# Patient Record
Sex: Male | Born: 1990
Health system: Southern US, Community
[De-identification: ages and names within clinical notes are randomized; demographics above are authoritative.]

## PROBLEM LIST (undated history)

## (undated) DIAGNOSIS — F32A Depression, unspecified: Secondary | ICD-10-CM

## (undated) DIAGNOSIS — J189 Pneumonia, unspecified organism: Secondary | ICD-10-CM

---

## 2018-10-28 DIAGNOSIS — Z6836 Body mass index (BMI) 36.0-36.9, adult: Secondary | ICD-10-CM | POA: Diagnosis not present

## 2018-10-28 DIAGNOSIS — F17200 Nicotine dependence, unspecified, uncomplicated: Secondary | ICD-10-CM | POA: Diagnosis not present

## 2018-10-28 DIAGNOSIS — A630 Anogenital (venereal) warts: Secondary | ICD-10-CM | POA: Diagnosis not present

## 2018-11-24 DIAGNOSIS — S0292XA Unspecified fracture of facial bones, initial encounter for closed fracture: Secondary | ICD-10-CM

## 2018-11-24 DIAGNOSIS — S52501A Unspecified fracture of the lower end of right radius, initial encounter for closed fracture: Secondary | ICD-10-CM

## 2018-11-24 DIAGNOSIS — S02411A LeFort I fracture, initial encounter for closed fracture: Secondary | ICD-10-CM

## 2018-11-24 DIAGNOSIS — T148XXA Other injury of unspecified body region, initial encounter: Secondary | ICD-10-CM

## 2018-11-24 DIAGNOSIS — S270XXA Traumatic pneumothorax, initial encounter: Secondary | ICD-10-CM

## 2018-11-24 DIAGNOSIS — S32009D Unspecified fracture of unspecified lumbar vertebra, subsequent encounter for fracture with routine healing: Secondary | ICD-10-CM

## 2018-11-24 DIAGNOSIS — S2242XD Multiple fractures of ribs, left side, subsequent encounter for fracture with routine healing: Secondary | ICD-10-CM

## 2018-11-24 DIAGNOSIS — S02412A LeFort II fracture, initial encounter for closed fracture: Secondary | ICD-10-CM

## 2018-11-24 DIAGNOSIS — S36039A Unspecified laceration of spleen, initial encounter: Secondary | ICD-10-CM

## 2018-11-24 DIAGNOSIS — Z7409 Other reduced mobility: Secondary | ICD-10-CM

## 2018-11-24 DIAGNOSIS — S92909A Unspecified fracture of unspecified foot, initial encounter for closed fracture: Secondary | ICD-10-CM

## 2018-11-24 DIAGNOSIS — S92009B Unspecified fracture of unspecified calcaneus, initial encounter for open fracture: Secondary | ICD-10-CM

## 2018-11-24 DIAGNOSIS — S02413A LeFort III fracture, initial encounter for closed fracture: Secondary | ICD-10-CM

## 2018-11-24 DIAGNOSIS — M84475K Pathological fracture, left foot, subsequent encounter for fracture with nonunion: Secondary | ICD-10-CM

## 2018-11-24 DIAGNOSIS — Z789 Other specified health status: Secondary | ICD-10-CM

## 2018-11-24 DIAGNOSIS — S62001A Unspecified fracture of navicular [scaphoid] bone of right wrist, initial encounter for closed fracture: Secondary | ICD-10-CM

## 2018-11-24 DIAGNOSIS — S82251C Displaced comminuted fracture of shaft of right tibia, initial encounter for open fracture type IIIA, IIIB, or IIIC: Secondary | ICD-10-CM

## 2018-11-24 DIAGNOSIS — L089 Local infection of the skin and subcutaneous tissue, unspecified: Secondary | ICD-10-CM

## 2018-11-24 HISTORY — DX: Unspecified fracture of unspecified calcaneus, initial encounter for open fracture: S92.009B

## 2018-11-24 HISTORY — DX: Pathological fracture, left foot, subsequent encounter for fracture with nonunion: M84.475K

## 2018-11-24 HISTORY — PX: TRACHEOSTOMY: SUR1362

## 2018-11-24 HISTORY — DX: Unspecified fracture of unspecified foot, initial encounter for closed fracture: S92.909A

## 2018-11-24 HISTORY — DX: Unspecified fracture of facial bones, initial encounter for closed fracture: S02.92XA

## 2018-11-24 HISTORY — DX: Unspecified fracture of the lower end of right radius, initial encounter for closed fracture: S52.501A

## 2018-11-24 HISTORY — DX: Lefort i fracture, initial encounter for closed fracture: S02.411A

## 2018-11-24 HISTORY — DX: Other specified health status: Z78.9

## 2018-11-24 HISTORY — DX: Unspecified fracture of unspecified lumbar vertebra, subsequent encounter for fracture with routine healing: S32.009D

## 2018-11-24 HISTORY — DX: Displaced comminuted fracture of shaft of right tibia, initial encounter for open fracture type IIIA, IIIB, or IIIC: S82.251C

## 2018-11-24 HISTORY — DX: Unspecified laceration of spleen, initial encounter: S36.039A

## 2018-11-24 HISTORY — DX: Traumatic pneumothorax, initial encounter: S27.0XXA

## 2018-11-24 HISTORY — DX: Unspecified fracture of navicular (scaphoid) bone of right wrist, initial encounter for closed fracture: S62.001A

## 2018-11-24 HISTORY — DX: Multiple fractures of ribs, left side, subsequent encounter for fracture with routine healing: S22.42XD

## 2018-11-24 HISTORY — PX: WRIST SURGERY: SHX841

## 2018-11-24 HISTORY — DX: Lefort ii fracture, initial encounter for closed fracture: S02.412A

## 2018-11-24 HISTORY — DX: Other reduced mobility: Z74.09

## 2018-11-24 HISTORY — DX: LeFort III fracture, initial encounter for closed fracture: S02.413A

## 2018-11-24 HISTORY — DX: Local infection of the skin and subcutaneous tissue, unspecified: L08.9

## 2018-11-24 HISTORY — DX: Other injury of unspecified body region, initial encounter: T14.8XXA

## 2019-01-17 DIAGNOSIS — R5383 Other fatigue: Secondary | ICD-10-CM | POA: Diagnosis not present

## 2019-01-17 DIAGNOSIS — Z0000 Encounter for general adult medical examination without abnormal findings: Secondary | ICD-10-CM | POA: Diagnosis not present

## 2019-01-17 DIAGNOSIS — A630 Anogenital (venereal) warts: Secondary | ICD-10-CM | POA: Diagnosis not present

## 2019-01-17 DIAGNOSIS — F17200 Nicotine dependence, unspecified, uncomplicated: Secondary | ICD-10-CM | POA: Diagnosis not present

## 2019-01-17 DIAGNOSIS — Z87898 Personal history of other specified conditions: Secondary | ICD-10-CM | POA: Diagnosis not present

## 2019-03-04 DIAGNOSIS — S0280XA Fracture of other specified skull and facial bones, unspecified side, initial encounter for closed fracture: Secondary | ICD-10-CM | POA: Diagnosis not present

## 2019-03-04 DIAGNOSIS — S36031A Moderate laceration of spleen, initial encounter: Secondary | ICD-10-CM | POA: Diagnosis not present

## 2019-03-04 DIAGNOSIS — T1490XA Injury, unspecified, initial encounter: Secondary | ICD-10-CM | POA: Diagnosis not present

## 2019-03-04 DIAGNOSIS — S22089A Unspecified fracture of T11-T12 vertebra, initial encounter for closed fracture: Secondary | ICD-10-CM | POA: Diagnosis not present

## 2019-03-04 DIAGNOSIS — S32039A Unspecified fracture of third lumbar vertebra, initial encounter for closed fracture: Secondary | ICD-10-CM | POA: Diagnosis not present

## 2019-03-04 DIAGNOSIS — M79642 Pain in left hand: Secondary | ICD-10-CM | POA: Diagnosis not present

## 2019-03-04 DIAGNOSIS — S62034A Nondisplaced fracture of proximal third of navicular [scaphoid] bone of right wrist, initial encounter for closed fracture: Secondary | ICD-10-CM | POA: Diagnosis not present

## 2019-03-04 DIAGNOSIS — Z1159 Encounter for screening for other viral diseases: Secondary | ICD-10-CM | POA: Diagnosis not present

## 2019-03-04 DIAGNOSIS — S92141B Displaced dome fracture of right talus, initial encounter for open fracture: Secondary | ICD-10-CM | POA: Diagnosis not present

## 2019-03-04 DIAGNOSIS — S52501A Unspecified fracture of the lower end of right radius, initial encounter for closed fracture: Secondary | ICD-10-CM | POA: Insufficient documentation

## 2019-03-04 DIAGNOSIS — S32049A Unspecified fracture of fourth lumbar vertebra, initial encounter for closed fracture: Secondary | ICD-10-CM | POA: Diagnosis not present

## 2019-03-04 DIAGNOSIS — R0689 Other abnormalities of breathing: Secondary | ICD-10-CM | POA: Diagnosis not present

## 2019-03-04 DIAGNOSIS — T794XXA Traumatic shock, initial encounter: Secondary | ICD-10-CM | POA: Diagnosis not present

## 2019-03-04 DIAGNOSIS — M25571 Pain in right ankle and joints of right foot: Secondary | ICD-10-CM | POA: Diagnosis not present

## 2019-03-04 DIAGNOSIS — S92322B Displaced fracture of second metatarsal bone, left foot, initial encounter for open fracture: Secondary | ICD-10-CM | POA: Diagnosis not present

## 2019-03-04 DIAGNOSIS — S92322A Displaced fracture of second metatarsal bone, left foot, initial encounter for closed fracture: Secondary | ICD-10-CM | POA: Diagnosis not present

## 2019-03-04 DIAGNOSIS — S85151A Other specified injury of anterior tibial artery, right leg, initial encounter: Secondary | ICD-10-CM | POA: Diagnosis not present

## 2019-03-04 DIAGNOSIS — S52611A Displaced fracture of right ulna styloid process, initial encounter for closed fracture: Secondary | ICD-10-CM | POA: Diagnosis not present

## 2019-03-04 DIAGNOSIS — S92141A Displaced dome fracture of right talus, initial encounter for closed fracture: Secondary | ICD-10-CM | POA: Diagnosis not present

## 2019-03-04 DIAGNOSIS — S92901A Unspecified fracture of right foot, initial encounter for closed fracture: Secondary | ICD-10-CM | POA: Diagnosis not present

## 2019-03-04 DIAGNOSIS — S32019A Unspecified fracture of first lumbar vertebra, initial encounter for closed fracture: Secondary | ICD-10-CM | POA: Diagnosis not present

## 2019-03-04 DIAGNOSIS — T80319A ABO incompatibility with hemolytic transfusion reaction, unspecified, initial encounter: Secondary | ICD-10-CM | POA: Diagnosis not present

## 2019-03-04 DIAGNOSIS — J9811 Atelectasis: Secondary | ICD-10-CM | POA: Diagnosis not present

## 2019-03-04 DIAGNOSIS — S32029A Unspecified fracture of second lumbar vertebra, initial encounter for closed fracture: Secondary | ICD-10-CM | POA: Diagnosis not present

## 2019-03-04 DIAGNOSIS — S81831A Puncture wound without foreign body, right lower leg, initial encounter: Secondary | ICD-10-CM | POA: Diagnosis not present

## 2019-03-04 DIAGNOSIS — S82461B Displaced segmental fracture of shaft of right fibula, initial encounter for open fracture type I or II: Secondary | ICD-10-CM | POA: Diagnosis not present

## 2019-03-04 DIAGNOSIS — S82221C Displaced transverse fracture of shaft of right tibia, initial encounter for open fracture type IIIA, IIIB, or IIIC: Secondary | ICD-10-CM | POA: Diagnosis not present

## 2019-03-04 DIAGNOSIS — M79605 Pain in left leg: Secondary | ICD-10-CM | POA: Diagnosis not present

## 2019-03-04 DIAGNOSIS — M25572 Pain in left ankle and joints of left foot: Secondary | ICD-10-CM | POA: Diagnosis not present

## 2019-03-04 DIAGNOSIS — S42301A Unspecified fracture of shaft of humerus, right arm, initial encounter for closed fracture: Secondary | ICD-10-CM | POA: Diagnosis not present

## 2019-03-04 DIAGNOSIS — S2242XA Multiple fractures of ribs, left side, initial encounter for closed fracture: Secondary | ICD-10-CM | POA: Diagnosis not present

## 2019-03-04 DIAGNOSIS — G8911 Acute pain due to trauma: Secondary | ICD-10-CM | POA: Diagnosis not present

## 2019-03-04 DIAGNOSIS — Z4682 Encounter for fitting and adjustment of non-vascular catheter: Secondary | ICD-10-CM | POA: Diagnosis not present

## 2019-03-04 DIAGNOSIS — S01511A Laceration without foreign body of lip, initial encounter: Secondary | ICD-10-CM | POA: Diagnosis not present

## 2019-03-04 DIAGNOSIS — S82461C Displaced segmental fracture of shaft of right fibula, initial encounter for open fracture type IIIA, IIIB, or IIIC: Secondary | ICD-10-CM | POA: Diagnosis not present

## 2019-03-04 DIAGNOSIS — S82001A Unspecified fracture of right patella, initial encounter for closed fracture: Secondary | ICD-10-CM | POA: Diagnosis not present

## 2019-03-04 DIAGNOSIS — S0231XA Fracture of orbital floor, right side, initial encounter for closed fracture: Secondary | ICD-10-CM | POA: Diagnosis not present

## 2019-03-04 DIAGNOSIS — S0990XA Unspecified injury of head, initial encounter: Secondary | ICD-10-CM | POA: Diagnosis not present

## 2019-03-04 DIAGNOSIS — S82201J Unspecified fracture of shaft of right tibia, subsequent encounter for open fracture type IIIA, IIIB, or IIIC with delayed healing: Secondary | ICD-10-CM | POA: Diagnosis not present

## 2019-03-04 DIAGNOSIS — S81801A Unspecified open wound, right lower leg, initial encounter: Secondary | ICD-10-CM | POA: Diagnosis not present

## 2019-03-04 DIAGNOSIS — Z4659 Encounter for fitting and adjustment of other gastrointestinal appliance and device: Secondary | ICD-10-CM | POA: Diagnosis not present

## 2019-03-04 DIAGNOSIS — S82451A Displaced comminuted fracture of shaft of right fibula, initial encounter for closed fracture: Secondary | ICD-10-CM | POA: Diagnosis not present

## 2019-03-04 DIAGNOSIS — S82291B Other fracture of shaft of right tibia, initial encounter for open fracture type I or II: Secondary | ICD-10-CM | POA: Diagnosis not present

## 2019-03-04 DIAGNOSIS — S82401B Unspecified fracture of shaft of right fibula, initial encounter for open fracture type I or II: Secondary | ICD-10-CM | POA: Diagnosis not present

## 2019-03-04 DIAGNOSIS — S82251C Displaced comminuted fracture of shaft of right tibia, initial encounter for open fracture type IIIA, IIIB, or IIIC: Secondary | ICD-10-CM | POA: Diagnosis not present

## 2019-03-04 DIAGNOSIS — S8292XA Unspecified fracture of left lower leg, initial encounter for closed fracture: Secondary | ICD-10-CM | POA: Diagnosis not present

## 2019-03-04 DIAGNOSIS — S2232XA Fracture of one rib, left side, initial encounter for closed fracture: Secondary | ICD-10-CM | POA: Diagnosis not present

## 2019-03-04 DIAGNOSIS — R1310 Dysphagia, unspecified: Secondary | ICD-10-CM | POA: Diagnosis not present

## 2019-03-04 DIAGNOSIS — Z041 Encounter for examination and observation following transport accident: Secondary | ICD-10-CM | POA: Diagnosis not present

## 2019-03-04 DIAGNOSIS — S62014A Nondisplaced fracture of distal pole of navicular [scaphoid] bone of right wrist, initial encounter for closed fracture: Secondary | ICD-10-CM | POA: Diagnosis not present

## 2019-03-04 DIAGNOSIS — S82401A Unspecified fracture of shaft of right fibula, initial encounter for closed fracture: Secondary | ICD-10-CM | POA: Diagnosis not present

## 2019-03-04 DIAGNOSIS — S52501D Unspecified fracture of the lower end of right radius, subsequent encounter for closed fracture with routine healing: Secondary | ICD-10-CM | POA: Diagnosis not present

## 2019-03-04 DIAGNOSIS — R633 Feeding difficulties: Secondary | ICD-10-CM | POA: Diagnosis not present

## 2019-03-04 DIAGNOSIS — S92902B Unspecified fracture of left foot, initial encounter for open fracture: Secondary | ICD-10-CM | POA: Diagnosis not present

## 2019-03-04 DIAGNOSIS — R578 Other shock: Secondary | ICD-10-CM | POA: Diagnosis not present

## 2019-03-04 DIAGNOSIS — S92352A Displaced fracture of fifth metatarsal bone, left foot, initial encounter for closed fracture: Secondary | ICD-10-CM | POA: Diagnosis not present

## 2019-03-04 DIAGNOSIS — S32110A Nondisplaced Zone I fracture of sacrum, initial encounter for closed fracture: Secondary | ICD-10-CM | POA: Diagnosis not present

## 2019-03-04 DIAGNOSIS — S82251A Displaced comminuted fracture of shaft of right tibia, initial encounter for closed fracture: Secondary | ICD-10-CM | POA: Diagnosis not present

## 2019-03-04 DIAGNOSIS — S92322D Displaced fracture of second metatarsal bone, left foot, subsequent encounter for fracture with routine healing: Secondary | ICD-10-CM | POA: Diagnosis not present

## 2019-03-04 DIAGNOSIS — S82251B Displaced comminuted fracture of shaft of right tibia, initial encounter for open fracture type I or II: Secondary | ICD-10-CM | POA: Diagnosis not present

## 2019-03-04 DIAGNOSIS — S82201C Unspecified fracture of shaft of right tibia, initial encounter for open fracture type IIIA, IIIB, or IIIC: Secondary | ICD-10-CM | POA: Diagnosis not present

## 2019-03-04 DIAGNOSIS — S52571A Other intraarticular fracture of lower end of right radius, initial encounter for closed fracture: Secondary | ICD-10-CM | POA: Diagnosis not present

## 2019-03-04 DIAGNOSIS — M79672 Pain in left foot: Secondary | ICD-10-CM | POA: Diagnosis not present

## 2019-03-04 DIAGNOSIS — J189 Pneumonia, unspecified organism: Secondary | ICD-10-CM | POA: Diagnosis not present

## 2019-03-04 DIAGNOSIS — S93325A Dislocation of tarsometatarsal joint of left foot, initial encounter: Secondary | ICD-10-CM | POA: Diagnosis not present

## 2019-03-04 DIAGNOSIS — S8262XD Displaced fracture of lateral malleolus of left fibula, subsequent encounter for closed fracture with routine healing: Secondary | ICD-10-CM | POA: Diagnosis not present

## 2019-03-04 DIAGNOSIS — S2222XA Fracture of body of sternum, initial encounter for closed fracture: Secondary | ICD-10-CM | POA: Diagnosis not present

## 2019-03-04 DIAGNOSIS — S52502A Unspecified fracture of the lower end of left radius, initial encounter for closed fracture: Secondary | ICD-10-CM | POA: Diagnosis not present

## 2019-03-04 DIAGNOSIS — S199XXA Unspecified injury of neck, initial encounter: Secondary | ICD-10-CM | POA: Diagnosis not present

## 2019-03-04 DIAGNOSIS — S36420A Contusion of duodenum, initial encounter: Secondary | ICD-10-CM | POA: Diagnosis not present

## 2019-03-04 DIAGNOSIS — S92001B Unspecified fracture of right calcaneus, initial encounter for open fracture: Secondary | ICD-10-CM | POA: Diagnosis not present

## 2019-03-04 DIAGNOSIS — S02411A LeFort I fracture, initial encounter for closed fracture: Secondary | ICD-10-CM | POA: Diagnosis not present

## 2019-03-04 DIAGNOSIS — R140 Abdominal distension (gaseous): Secondary | ICD-10-CM | POA: Diagnosis not present

## 2019-03-04 DIAGNOSIS — S02413A LeFort III fracture, initial encounter for closed fracture: Secondary | ICD-10-CM | POA: Diagnosis not present

## 2019-03-04 DIAGNOSIS — S32018A Other fracture of first lumbar vertebra, initial encounter for closed fracture: Secondary | ICD-10-CM | POA: Diagnosis not present

## 2019-03-04 DIAGNOSIS — S36892A Contusion of other intra-abdominal organs, initial encounter: Secondary | ICD-10-CM | POA: Diagnosis not present

## 2019-03-04 DIAGNOSIS — S270XXA Traumatic pneumothorax, initial encounter: Secondary | ICD-10-CM | POA: Diagnosis not present

## 2019-03-04 DIAGNOSIS — M79601 Pain in right arm: Secondary | ICD-10-CM | POA: Diagnosis not present

## 2019-03-04 DIAGNOSIS — S81822A Laceration with foreign body, left lower leg, initial encounter: Secondary | ICD-10-CM | POA: Diagnosis not present

## 2019-03-04 DIAGNOSIS — S32129A Unspecified Zone II fracture of sacrum, initial encounter for closed fracture: Secondary | ICD-10-CM | POA: Diagnosis not present

## 2019-03-04 DIAGNOSIS — S92061B Displaced intraarticular fracture of right calcaneus, initial encounter for open fracture: Secondary | ICD-10-CM | POA: Diagnosis not present

## 2019-03-04 DIAGNOSIS — S32048A Other fracture of fourth lumbar vertebra, initial encounter for closed fracture: Secondary | ICD-10-CM | POA: Diagnosis not present

## 2019-03-04 DIAGNOSIS — R000 Tachycardia, unspecified: Secondary | ICD-10-CM | POA: Diagnosis not present

## 2019-03-04 DIAGNOSIS — S36032A Major laceration of spleen, initial encounter: Secondary | ICD-10-CM | POA: Diagnosis not present

## 2019-03-04 DIAGNOSIS — S3991XA Unspecified injury of abdomen, initial encounter: Secondary | ICD-10-CM | POA: Diagnosis not present

## 2019-03-04 DIAGNOSIS — S92332D Displaced fracture of third metatarsal bone, left foot, subsequent encounter for fracture with routine healing: Secondary | ICD-10-CM | POA: Diagnosis not present

## 2019-03-04 DIAGNOSIS — S62001A Unspecified fracture of navicular [scaphoid] bone of right wrist, initial encounter for closed fracture: Secondary | ICD-10-CM | POA: Diagnosis not present

## 2019-03-04 DIAGNOSIS — S01512A Laceration without foreign body of oral cavity, initial encounter: Secondary | ICD-10-CM | POA: Diagnosis not present

## 2019-03-04 DIAGNOSIS — S9304XA Dislocation of right ankle joint, initial encounter: Secondary | ICD-10-CM | POA: Diagnosis not present

## 2019-03-04 DIAGNOSIS — S3210XA Unspecified fracture of sacrum, initial encounter for closed fracture: Secondary | ICD-10-CM | POA: Diagnosis not present

## 2019-03-04 DIAGNOSIS — S92812B Other fracture of left foot, initial encounter for open fracture: Secondary | ICD-10-CM | POA: Diagnosis not present

## 2019-03-04 DIAGNOSIS — R918 Other nonspecific abnormal finding of lung field: Secondary | ICD-10-CM | POA: Diagnosis not present

## 2019-03-04 DIAGNOSIS — S93325D Dislocation of tarsometatarsal joint of left foot, subsequent encounter: Secondary | ICD-10-CM | POA: Diagnosis not present

## 2019-03-04 DIAGNOSIS — S92061A Displaced intraarticular fracture of right calcaneus, initial encounter for closed fracture: Secondary | ICD-10-CM | POA: Diagnosis not present

## 2019-03-04 DIAGNOSIS — S02412A LeFort II fracture, initial encounter for closed fracture: Secondary | ICD-10-CM | POA: Diagnosis not present

## 2019-03-04 DIAGNOSIS — R4020 Unspecified coma: Secondary | ICD-10-CM | POA: Diagnosis not present

## 2019-03-04 DIAGNOSIS — S82291C Other fracture of shaft of right tibia, initial encounter for open fracture type IIIA, IIIB, or IIIC: Secondary | ICD-10-CM | POA: Diagnosis not present

## 2019-03-04 DIAGNOSIS — S32120A Nondisplaced Zone II fracture of sacrum, initial encounter for closed fracture: Secondary | ICD-10-CM | POA: Diagnosis not present

## 2019-03-04 DIAGNOSIS — S52601A Unspecified fracture of lower end of right ulna, initial encounter for closed fracture: Secondary | ICD-10-CM | POA: Diagnosis not present

## 2019-03-04 DIAGNOSIS — S81801D Unspecified open wound, right lower leg, subsequent encounter: Secondary | ICD-10-CM | POA: Diagnosis not present

## 2019-03-04 DIAGNOSIS — S92312A Displaced fracture of first metatarsal bone, left foot, initial encounter for closed fracture: Secondary | ICD-10-CM | POA: Diagnosis not present

## 2019-03-04 DIAGNOSIS — R109 Unspecified abdominal pain: Secondary | ICD-10-CM | POA: Diagnosis not present

## 2019-03-04 DIAGNOSIS — M79604 Pain in right leg: Secondary | ICD-10-CM | POA: Diagnosis not present

## 2019-03-04 DIAGNOSIS — S0240CA Maxillary fracture, right side, initial encounter for closed fracture: Secondary | ICD-10-CM | POA: Diagnosis not present

## 2019-03-04 DIAGNOSIS — S0240EA Zygomatic fracture, right side, initial encounter for closed fracture: Secondary | ICD-10-CM | POA: Diagnosis not present

## 2019-03-04 DIAGNOSIS — M79641 Pain in right hand: Secondary | ICD-10-CM | POA: Diagnosis not present

## 2019-03-04 DIAGNOSIS — S32028A Other fracture of second lumbar vertebra, initial encounter for closed fracture: Secondary | ICD-10-CM | POA: Diagnosis not present

## 2019-03-04 DIAGNOSIS — E872 Acidosis: Secondary | ICD-10-CM | POA: Diagnosis not present

## 2019-03-04 DIAGNOSIS — S8291XA Unspecified fracture of right lower leg, initial encounter for closed fracture: Secondary | ICD-10-CM | POA: Diagnosis not present

## 2019-03-04 DIAGNOSIS — S52201A Unspecified fracture of shaft of right ulna, initial encounter for closed fracture: Secondary | ICD-10-CM | POA: Diagnosis not present

## 2019-03-04 DIAGNOSIS — S92902A Unspecified fracture of left foot, initial encounter for closed fracture: Secondary | ICD-10-CM | POA: Diagnosis not present

## 2019-03-04 DIAGNOSIS — S92332A Displaced fracture of third metatarsal bone, left foot, initial encounter for closed fracture: Secondary | ICD-10-CM | POA: Diagnosis not present

## 2019-03-04 DIAGNOSIS — K661 Hemoperitoneum: Secondary | ICD-10-CM | POA: Diagnosis not present

## 2019-03-04 DIAGNOSIS — S93115A Dislocation of interphalangeal joint of left lesser toe(s), initial encounter: Secondary | ICD-10-CM | POA: Diagnosis not present

## 2019-03-04 DIAGNOSIS — S82201B Unspecified fracture of shaft of right tibia, initial encounter for open fracture type I or II: Secondary | ICD-10-CM | POA: Diagnosis not present

## 2019-03-05 DIAGNOSIS — M79672 Pain in left foot: Secondary | ICD-10-CM | POA: Diagnosis not present

## 2019-03-05 DIAGNOSIS — J9811 Atelectasis: Secondary | ICD-10-CM | POA: Diagnosis not present

## 2019-03-05 DIAGNOSIS — K661 Hemoperitoneum: Secondary | ICD-10-CM | POA: Diagnosis not present

## 2019-03-05 DIAGNOSIS — S92141A Displaced dome fracture of right talus, initial encounter for closed fracture: Secondary | ICD-10-CM | POA: Diagnosis not present

## 2019-03-05 DIAGNOSIS — S3991XA Unspecified injury of abdomen, initial encounter: Secondary | ICD-10-CM | POA: Diagnosis not present

## 2019-03-05 DIAGNOSIS — S92061A Displaced intraarticular fracture of right calcaneus, initial encounter for closed fracture: Secondary | ICD-10-CM | POA: Diagnosis not present

## 2019-03-05 DIAGNOSIS — S92061B Displaced intraarticular fracture of right calcaneus, initial encounter for open fracture: Secondary | ICD-10-CM | POA: Diagnosis not present

## 2019-03-05 DIAGNOSIS — S82201C Unspecified fracture of shaft of right tibia, initial encounter for open fracture type IIIA, IIIB, or IIIC: Secondary | ICD-10-CM | POA: Diagnosis not present

## 2019-03-05 DIAGNOSIS — S82291B Other fracture of shaft of right tibia, initial encounter for open fracture type I or II: Secondary | ICD-10-CM | POA: Diagnosis not present

## 2019-03-05 HISTORY — PX: PERCUTANEOUS PINNING TOE FRACTURE: SUR1018

## 2019-03-05 HISTORY — PX: I & D EXTREMITY: SHX5045

## 2019-03-05 HISTORY — PX: DEBRIDEMENT LEG: SUR390

## 2019-03-05 HISTORY — PX: FACIAL LACERATIONS REPAIR: SHX1571

## 2019-03-06 DIAGNOSIS — R140 Abdominal distension (gaseous): Secondary | ICD-10-CM | POA: Diagnosis not present

## 2019-03-06 DIAGNOSIS — S32018A Other fracture of first lumbar vertebra, initial encounter for closed fracture: Secondary | ICD-10-CM | POA: Diagnosis not present

## 2019-03-06 DIAGNOSIS — S32048A Other fracture of fourth lumbar vertebra, initial encounter for closed fracture: Secondary | ICD-10-CM | POA: Diagnosis not present

## 2019-03-06 DIAGNOSIS — S32028A Other fracture of second lumbar vertebra, initial encounter for closed fracture: Secondary | ICD-10-CM | POA: Diagnosis not present

## 2019-03-07 DIAGNOSIS — S36031A Moderate laceration of spleen, initial encounter: Secondary | ICD-10-CM | POA: Diagnosis not present

## 2019-03-07 DIAGNOSIS — S82251C Displaced comminuted fracture of shaft of right tibia, initial encounter for open fracture type IIIA, IIIB, or IIIC: Secondary | ICD-10-CM | POA: Diagnosis not present

## 2019-03-07 DIAGNOSIS — S2232XA Fracture of one rib, left side, initial encounter for closed fracture: Secondary | ICD-10-CM | POA: Diagnosis not present

## 2019-03-07 HISTORY — PX: ORIF CALCANEAL FRACTURE: SUR921

## 2019-03-07 HISTORY — PX: IM NAILING TIBIA: SUR734

## 2019-03-07 HISTORY — PX: OTHER SURGICAL HISTORY: SHX169

## 2019-03-07 HISTORY — PX: ORIF DISTAL RADIUS FRACTURE: SUR927

## 2019-03-08 DIAGNOSIS — Z4659 Encounter for fitting and adjustment of other gastrointestinal appliance and device: Secondary | ICD-10-CM | POA: Diagnosis not present

## 2019-03-08 DIAGNOSIS — S93325D Dislocation of tarsometatarsal joint of left foot, subsequent encounter: Secondary | ICD-10-CM | POA: Diagnosis not present

## 2019-03-08 DIAGNOSIS — S8262XD Displaced fracture of lateral malleolus of left fibula, subsequent encounter for closed fracture with routine healing: Secondary | ICD-10-CM | POA: Diagnosis not present

## 2019-03-08 DIAGNOSIS — S92332D Displaced fracture of third metatarsal bone, left foot, subsequent encounter for fracture with routine healing: Secondary | ICD-10-CM | POA: Diagnosis not present

## 2019-03-08 DIAGNOSIS — S92322D Displaced fracture of second metatarsal bone, left foot, subsequent encounter for fracture with routine healing: Secondary | ICD-10-CM | POA: Diagnosis not present

## 2019-03-09 DIAGNOSIS — S82251A Displaced comminuted fracture of shaft of right tibia, initial encounter for closed fracture: Secondary | ICD-10-CM | POA: Diagnosis not present

## 2019-03-09 HISTORY — PX: DEBRIDEMENT LEG: SUR390

## 2019-03-11 HISTORY — PX: DEBRIDEMENT LEG: SUR390

## 2019-03-11 HISTORY — PX: ORIF FACIAL FRACTURE: SHX2118

## 2019-03-12 DIAGNOSIS — S85151A Other specified injury of anterior tibial artery, right leg, initial encounter: Secondary | ICD-10-CM | POA: Diagnosis not present

## 2019-03-13 DIAGNOSIS — R918 Other nonspecific abnormal finding of lung field: Secondary | ICD-10-CM | POA: Diagnosis not present

## 2019-03-14 DIAGNOSIS — S92902B Unspecified fracture of left foot, initial encounter for open fracture: Secondary | ICD-10-CM | POA: Diagnosis not present

## 2019-03-14 DIAGNOSIS — Z4682 Encounter for fitting and adjustment of non-vascular catheter: Secondary | ICD-10-CM | POA: Diagnosis not present

## 2019-03-14 DIAGNOSIS — S62034A Nondisplaced fracture of proximal third of navicular [scaphoid] bone of right wrist, initial encounter for closed fracture: Secondary | ICD-10-CM | POA: Diagnosis not present

## 2019-03-14 DIAGNOSIS — S2242XA Multiple fractures of ribs, left side, initial encounter for closed fracture: Secondary | ICD-10-CM | POA: Diagnosis not present

## 2019-03-14 HISTORY — PX: DEBRIDEMENT LEG: SUR390

## 2019-03-14 HISTORY — PX: OTHER SURGICAL HISTORY: SHX169

## 2019-03-14 HISTORY — PX: ORIF FINGER FRACTURE: SHX2122

## 2019-03-16 DIAGNOSIS — R768 Other specified abnormal immunological findings in serum: Secondary | ICD-10-CM | POA: Insufficient documentation

## 2019-03-16 DIAGNOSIS — R7689 Other specified abnormal immunological findings in serum: Secondary | ICD-10-CM | POA: Insufficient documentation

## 2019-03-16 HISTORY — PX: OTHER SURGICAL HISTORY: SHX169

## 2019-03-18 DIAGNOSIS — Z4659 Encounter for fitting and adjustment of other gastrointestinal appliance and device: Secondary | ICD-10-CM | POA: Diagnosis not present

## 2019-03-20 DIAGNOSIS — Z4659 Encounter for fitting and adjustment of other gastrointestinal appliance and device: Secondary | ICD-10-CM | POA: Diagnosis not present

## 2019-03-23 DIAGNOSIS — R1310 Dysphagia, unspecified: Secondary | ICD-10-CM | POA: Diagnosis not present

## 2019-03-23 DIAGNOSIS — R633 Feeding difficulties: Secondary | ICD-10-CM | POA: Diagnosis not present

## 2019-03-24 DIAGNOSIS — S82251E Displaced comminuted fracture of shaft of right tibia, subsequent encounter for open fracture type I or II with routine healing: Secondary | ICD-10-CM | POA: Diagnosis not present

## 2019-03-24 DIAGNOSIS — S82201E Unspecified fracture of shaft of right tibia, subsequent encounter for open fracture type I or II with routine healing: Secondary | ICD-10-CM | POA: Diagnosis not present

## 2019-03-24 DIAGNOSIS — S22089D Unspecified fracture of T11-T12 vertebra, subsequent encounter for fracture with routine healing: Secondary | ICD-10-CM | POA: Diagnosis not present

## 2019-03-24 DIAGNOSIS — S92253D Displaced fracture of navicular [scaphoid] of unspecified foot, subsequent encounter for fracture with routine healing: Secondary | ICD-10-CM | POA: Diagnosis not present

## 2019-03-24 DIAGNOSIS — S82401E Unspecified fracture of shaft of right fibula, subsequent encounter for open fracture type I or II with routine healing: Secondary | ICD-10-CM | POA: Diagnosis not present

## 2019-03-24 DIAGNOSIS — S3289XA Fracture of other parts of pelvis, initial encounter for closed fracture: Secondary | ICD-10-CM | POA: Diagnosis not present

## 2019-03-24 DIAGNOSIS — R109 Unspecified abdominal pain: Secondary | ICD-10-CM | POA: Diagnosis not present

## 2019-03-24 DIAGNOSIS — T82838A Hemorrhage of vascular prosthetic devices, implants and grafts, initial encounter: Secondary | ICD-10-CM | POA: Diagnosis not present

## 2019-03-24 DIAGNOSIS — T1490XA Injury, unspecified, initial encounter: Secondary | ICD-10-CM | POA: Diagnosis not present

## 2019-03-24 DIAGNOSIS — S0231XD Fracture of orbital floor, right side, subsequent encounter for fracture with routine healing: Secondary | ICD-10-CM | POA: Diagnosis not present

## 2019-03-24 DIAGNOSIS — S32049D Unspecified fracture of fourth lumbar vertebra, subsequent encounter for fracture with routine healing: Secondary | ICD-10-CM | POA: Diagnosis not present

## 2019-03-24 DIAGNOSIS — S92811G Other fracture of right foot, subsequent encounter for fracture with delayed healing: Secondary | ICD-10-CM | POA: Diagnosis not present

## 2019-03-24 DIAGNOSIS — S32129D Unspecified Zone II fracture of sacrum, subsequent encounter for fracture with routine healing: Secondary | ICD-10-CM | POA: Diagnosis not present

## 2019-03-24 DIAGNOSIS — S92353D Displaced fracture of fifth metatarsal bone, unspecified foot, subsequent encounter for fracture with routine healing: Secondary | ICD-10-CM | POA: Diagnosis not present

## 2019-03-24 DIAGNOSIS — S92001D Unspecified fracture of right calcaneus, subsequent encounter for fracture with routine healing: Secondary | ICD-10-CM | POA: Diagnosis not present

## 2019-03-24 DIAGNOSIS — R29898 Other symptoms and signs involving the musculoskeletal system: Secondary | ICD-10-CM | POA: Diagnosis not present

## 2019-03-24 DIAGNOSIS — S81801A Unspecified open wound, right lower leg, initial encounter: Secondary | ICD-10-CM | POA: Diagnosis not present

## 2019-03-24 DIAGNOSIS — T86821 Skin graft (allograft) (autograft) failure: Secondary | ICD-10-CM | POA: Diagnosis not present

## 2019-03-24 DIAGNOSIS — S36031D Moderate laceration of spleen, subsequent encounter: Secondary | ICD-10-CM | POA: Diagnosis not present

## 2019-03-24 DIAGNOSIS — S92101D Unspecified fracture of right talus, subsequent encounter for fracture with routine healing: Secondary | ICD-10-CM | POA: Diagnosis not present

## 2019-03-24 DIAGNOSIS — T8189XA Other complications of procedures, not elsewhere classified, initial encounter: Secondary | ICD-10-CM | POA: Diagnosis not present

## 2019-03-24 DIAGNOSIS — T8131XA Disruption of external operation (surgical) wound, not elsewhere classified, initial encounter: Secondary | ICD-10-CM | POA: Diagnosis not present

## 2019-03-24 DIAGNOSIS — R279 Unspecified lack of coordination: Secondary | ICD-10-CM | POA: Diagnosis not present

## 2019-03-24 DIAGNOSIS — Z978 Presence of other specified devices: Secondary | ICD-10-CM | POA: Diagnosis not present

## 2019-03-24 DIAGNOSIS — Z743 Need for continuous supervision: Secondary | ICD-10-CM | POA: Diagnosis not present

## 2019-03-24 DIAGNOSIS — S92313D Displaced fracture of first metatarsal bone, unspecified foot, subsequent encounter for fracture with routine healing: Secondary | ICD-10-CM | POA: Diagnosis not present

## 2019-03-24 DIAGNOSIS — S62011D Displaced fracture of distal pole of navicular [scaphoid] bone of right wrist, subsequent encounter for fracture with routine healing: Secondary | ICD-10-CM | POA: Diagnosis not present

## 2019-03-24 DIAGNOSIS — T8469XA Infection and inflammatory reaction due to internal fixation device of other site, initial encounter: Secondary | ICD-10-CM | POA: Diagnosis not present

## 2019-03-24 DIAGNOSIS — R1310 Dysphagia, unspecified: Secondary | ICD-10-CM | POA: Diagnosis not present

## 2019-03-24 DIAGNOSIS — T84629A Infection and inflammatory reaction due to internal fixation device of unspecified bone of leg, initial encounter: Secondary | ICD-10-CM | POA: Diagnosis not present

## 2019-03-24 DIAGNOSIS — B965 Pseudomonas (aeruginosa) (mallei) (pseudomallei) as the cause of diseases classified elsewhere: Secondary | ICD-10-CM | POA: Diagnosis not present

## 2019-03-24 DIAGNOSIS — T1490XD Injury, unspecified, subsequent encounter: Secondary | ICD-10-CM | POA: Diagnosis not present

## 2019-03-24 DIAGNOSIS — S02609D Fracture of mandible, unspecified, subsequent encounter for fracture with routine healing: Secondary | ICD-10-CM | POA: Diagnosis not present

## 2019-03-24 DIAGNOSIS — R633 Feeding difficulties: Secondary | ICD-10-CM | POA: Diagnosis not present

## 2019-03-24 DIAGNOSIS — S2243XD Multiple fractures of ribs, bilateral, subsequent encounter for fracture with routine healing: Secondary | ICD-10-CM | POA: Diagnosis not present

## 2019-03-24 DIAGNOSIS — S62309D Unspecified fracture of unspecified metacarpal bone, subsequent encounter for fracture with routine healing: Secondary | ICD-10-CM | POA: Diagnosis not present

## 2019-03-24 DIAGNOSIS — S52501D Unspecified fracture of the lower end of right radius, subsequent encounter for closed fracture with routine healing: Secondary | ICD-10-CM | POA: Diagnosis not present

## 2019-03-27 DIAGNOSIS — Z978 Presence of other specified devices: Secondary | ICD-10-CM | POA: Diagnosis not present

## 2019-03-27 DIAGNOSIS — R109 Unspecified abdominal pain: Secondary | ICD-10-CM | POA: Diagnosis not present

## 2019-03-28 DIAGNOSIS — S82401E Unspecified fracture of shaft of right fibula, subsequent encounter for open fracture type I or II with routine healing: Secondary | ICD-10-CM | POA: Diagnosis not present

## 2019-03-28 DIAGNOSIS — S92001D Unspecified fracture of right calcaneus, subsequent encounter for fracture with routine healing: Secondary | ICD-10-CM | POA: Diagnosis not present

## 2019-03-28 DIAGNOSIS — S92101D Unspecified fracture of right talus, subsequent encounter for fracture with routine healing: Secondary | ICD-10-CM | POA: Diagnosis not present

## 2019-03-28 DIAGNOSIS — S82201E Unspecified fracture of shaft of right tibia, subsequent encounter for open fracture type I or II with routine healing: Secondary | ICD-10-CM | POA: Diagnosis not present

## 2019-03-29 DIAGNOSIS — S92001D Unspecified fracture of right calcaneus, subsequent encounter for fracture with routine healing: Secondary | ICD-10-CM | POA: Diagnosis not present

## 2019-03-29 DIAGNOSIS — S82201E Unspecified fracture of shaft of right tibia, subsequent encounter for open fracture type I or II with routine healing: Secondary | ICD-10-CM | POA: Diagnosis not present

## 2019-03-29 DIAGNOSIS — S02411A LeFort I fracture, initial encounter for closed fracture: Secondary | ICD-10-CM | POA: Insufficient documentation

## 2019-03-29 DIAGNOSIS — S92101D Unspecified fracture of right talus, subsequent encounter for fracture with routine healing: Secondary | ICD-10-CM | POA: Diagnosis not present

## 2019-03-29 DIAGNOSIS — S82401E Unspecified fracture of shaft of right fibula, subsequent encounter for open fracture type I or II with routine healing: Secondary | ICD-10-CM | POA: Diagnosis not present

## 2019-03-29 DIAGNOSIS — S3210XA Unspecified fracture of sacrum, initial encounter for closed fracture: Secondary | ICD-10-CM | POA: Insufficient documentation

## 2019-03-29 DIAGNOSIS — S0292XA Unspecified fracture of facial bones, initial encounter for closed fracture: Secondary | ICD-10-CM | POA: Insufficient documentation

## 2019-03-29 DIAGNOSIS — S62001A Unspecified fracture of navicular [scaphoid] bone of right wrist, initial encounter for closed fracture: Secondary | ICD-10-CM | POA: Insufficient documentation

## 2019-03-29 DIAGNOSIS — S02413A LeFort III fracture, initial encounter for closed fracture: Secondary | ICD-10-CM | POA: Insufficient documentation

## 2019-03-30 DIAGNOSIS — S92001D Unspecified fracture of right calcaneus, subsequent encounter for fracture with routine healing: Secondary | ICD-10-CM | POA: Diagnosis not present

## 2019-03-30 DIAGNOSIS — S82201E Unspecified fracture of shaft of right tibia, subsequent encounter for open fracture type I or II with routine healing: Secondary | ICD-10-CM | POA: Diagnosis not present

## 2019-03-30 DIAGNOSIS — S82401E Unspecified fracture of shaft of right fibula, subsequent encounter for open fracture type I or II with routine healing: Secondary | ICD-10-CM | POA: Diagnosis not present

## 2019-03-30 DIAGNOSIS — S92101D Unspecified fracture of right talus, subsequent encounter for fracture with routine healing: Secondary | ICD-10-CM | POA: Diagnosis not present

## 2019-03-31 DIAGNOSIS — S92001D Unspecified fracture of right calcaneus, subsequent encounter for fracture with routine healing: Secondary | ICD-10-CM | POA: Diagnosis not present

## 2019-03-31 DIAGNOSIS — S92101D Unspecified fracture of right talus, subsequent encounter for fracture with routine healing: Secondary | ICD-10-CM | POA: Diagnosis not present

## 2019-03-31 DIAGNOSIS — S82401E Unspecified fracture of shaft of right fibula, subsequent encounter for open fracture type I or II with routine healing: Secondary | ICD-10-CM | POA: Diagnosis not present

## 2019-03-31 DIAGNOSIS — S82201E Unspecified fracture of shaft of right tibia, subsequent encounter for open fracture type I or II with routine healing: Secondary | ICD-10-CM | POA: Diagnosis not present

## 2019-04-01 DIAGNOSIS — S92811G Other fracture of right foot, subsequent encounter for fracture with delayed healing: Secondary | ICD-10-CM | POA: Diagnosis not present

## 2019-04-01 DIAGNOSIS — T8469XA Infection and inflammatory reaction due to internal fixation device of other site, initial encounter: Secondary | ICD-10-CM | POA: Diagnosis not present

## 2019-04-01 DIAGNOSIS — S82401E Unspecified fracture of shaft of right fibula, subsequent encounter for open fracture type I or II with routine healing: Secondary | ICD-10-CM | POA: Diagnosis not present

## 2019-04-01 DIAGNOSIS — S02609D Fracture of mandible, unspecified, subsequent encounter for fracture with routine healing: Secondary | ICD-10-CM | POA: Diagnosis not present

## 2019-04-01 DIAGNOSIS — T1490XD Injury, unspecified, subsequent encounter: Secondary | ICD-10-CM | POA: Diagnosis not present

## 2019-04-01 DIAGNOSIS — S52501D Unspecified fracture of the lower end of right radius, subsequent encounter for closed fracture with routine healing: Secondary | ICD-10-CM | POA: Diagnosis not present

## 2019-04-01 DIAGNOSIS — S82201E Unspecified fracture of shaft of right tibia, subsequent encounter for open fracture type I or II with routine healing: Secondary | ICD-10-CM | POA: Diagnosis not present

## 2019-04-01 DIAGNOSIS — T82838A Hemorrhage of vascular prosthetic devices, implants and grafts, initial encounter: Secondary | ICD-10-CM | POA: Diagnosis not present

## 2019-04-04 DIAGNOSIS — S82401E Unspecified fracture of shaft of right fibula, subsequent encounter for open fracture type I or II with routine healing: Secondary | ICD-10-CM | POA: Diagnosis not present

## 2019-04-04 DIAGNOSIS — S52501D Unspecified fracture of the lower end of right radius, subsequent encounter for closed fracture with routine healing: Secondary | ICD-10-CM | POA: Diagnosis not present

## 2019-04-04 DIAGNOSIS — S02609D Fracture of mandible, unspecified, subsequent encounter for fracture with routine healing: Secondary | ICD-10-CM | POA: Diagnosis not present

## 2019-04-04 DIAGNOSIS — T84629A Infection and inflammatory reaction due to internal fixation device of unspecified bone of leg, initial encounter: Secondary | ICD-10-CM | POA: Diagnosis not present

## 2019-04-04 DIAGNOSIS — S82201E Unspecified fracture of shaft of right tibia, subsequent encounter for open fracture type I or II with routine healing: Secondary | ICD-10-CM | POA: Diagnosis not present

## 2019-04-04 DIAGNOSIS — S81801A Unspecified open wound, right lower leg, initial encounter: Secondary | ICD-10-CM | POA: Diagnosis not present

## 2019-04-04 DIAGNOSIS — T1490XA Injury, unspecified, initial encounter: Secondary | ICD-10-CM | POA: Diagnosis not present

## 2019-04-04 DIAGNOSIS — T82838A Hemorrhage of vascular prosthetic devices, implants and grafts, initial encounter: Secondary | ICD-10-CM | POA: Diagnosis not present

## 2019-04-04 HISTORY — PX: DEBRIDEMENT LEG: SUR390

## 2019-04-04 HISTORY — PX: OTHER SURGICAL HISTORY: SHX169

## 2019-04-05 DIAGNOSIS — T84629A Infection and inflammatory reaction due to internal fixation device of unspecified bone of leg, initial encounter: Secondary | ICD-10-CM | POA: Diagnosis not present

## 2019-04-05 DIAGNOSIS — S82201E Unspecified fracture of shaft of right tibia, subsequent encounter for open fracture type I or II with routine healing: Secondary | ICD-10-CM | POA: Diagnosis not present

## 2019-04-05 DIAGNOSIS — T1490XA Injury, unspecified, initial encounter: Secondary | ICD-10-CM | POA: Diagnosis not present

## 2019-04-05 DIAGNOSIS — T82838A Hemorrhage of vascular prosthetic devices, implants and grafts, initial encounter: Secondary | ICD-10-CM | POA: Diagnosis not present

## 2019-04-05 DIAGNOSIS — S82401E Unspecified fracture of shaft of right fibula, subsequent encounter for open fracture type I or II with routine healing: Secondary | ICD-10-CM | POA: Diagnosis not present

## 2019-04-05 DIAGNOSIS — S92001D Unspecified fracture of right calcaneus, subsequent encounter for fracture with routine healing: Secondary | ICD-10-CM | POA: Diagnosis not present

## 2019-04-05 DIAGNOSIS — S92101D Unspecified fracture of right talus, subsequent encounter for fracture with routine healing: Secondary | ICD-10-CM | POA: Diagnosis not present

## 2019-04-06 DIAGNOSIS — R1310 Dysphagia, unspecified: Secondary | ICD-10-CM | POA: Diagnosis not present

## 2019-04-06 DIAGNOSIS — T1490XA Injury, unspecified, initial encounter: Secondary | ICD-10-CM | POA: Diagnosis not present

## 2019-04-06 DIAGNOSIS — S02609D Fracture of mandible, unspecified, subsequent encounter for fracture with routine healing: Secondary | ICD-10-CM | POA: Diagnosis not present

## 2019-04-06 DIAGNOSIS — B965 Pseudomonas (aeruginosa) (mallei) (pseudomallei) as the cause of diseases classified elsewhere: Secondary | ICD-10-CM | POA: Diagnosis not present

## 2019-04-06 DIAGNOSIS — R633 Feeding difficulties: Secondary | ICD-10-CM | POA: Diagnosis not present

## 2019-04-06 DIAGNOSIS — S82401E Unspecified fracture of shaft of right fibula, subsequent encounter for open fracture type I or II with routine healing: Secondary | ICD-10-CM | POA: Diagnosis not present

## 2019-04-06 DIAGNOSIS — T82838A Hemorrhage of vascular prosthetic devices, implants and grafts, initial encounter: Secondary | ICD-10-CM | POA: Diagnosis not present

## 2019-04-06 DIAGNOSIS — T84629A Infection and inflammatory reaction due to internal fixation device of unspecified bone of leg, initial encounter: Secondary | ICD-10-CM | POA: Diagnosis not present

## 2019-04-06 DIAGNOSIS — S52501D Unspecified fracture of the lower end of right radius, subsequent encounter for closed fracture with routine healing: Secondary | ICD-10-CM | POA: Diagnosis not present

## 2019-04-06 DIAGNOSIS — S82201E Unspecified fracture of shaft of right tibia, subsequent encounter for open fracture type I or II with routine healing: Secondary | ICD-10-CM | POA: Diagnosis not present

## 2019-04-07 DIAGNOSIS — S52501D Unspecified fracture of the lower end of right radius, subsequent encounter for closed fracture with routine healing: Secondary | ICD-10-CM | POA: Diagnosis not present

## 2019-04-07 DIAGNOSIS — S02609D Fracture of mandible, unspecified, subsequent encounter for fracture with routine healing: Secondary | ICD-10-CM | POA: Diagnosis not present

## 2019-04-07 DIAGNOSIS — Z789 Other specified health status: Secondary | ICD-10-CM | POA: Insufficient documentation

## 2019-04-07 DIAGNOSIS — Z7409 Other reduced mobility: Secondary | ICD-10-CM | POA: Insufficient documentation

## 2019-04-07 DIAGNOSIS — L089 Local infection of the skin and subcutaneous tissue, unspecified: Secondary | ICD-10-CM | POA: Insufficient documentation

## 2019-04-07 DIAGNOSIS — S82201E Unspecified fracture of shaft of right tibia, subsequent encounter for open fracture type I or II with routine healing: Secondary | ICD-10-CM | POA: Diagnosis not present

## 2019-04-07 DIAGNOSIS — S82401E Unspecified fracture of shaft of right fibula, subsequent encounter for open fracture type I or II with routine healing: Secondary | ICD-10-CM | POA: Diagnosis not present

## 2019-04-08 DIAGNOSIS — S02609D Fracture of mandible, unspecified, subsequent encounter for fracture with routine healing: Secondary | ICD-10-CM | POA: Diagnosis not present

## 2019-04-08 DIAGNOSIS — S82201E Unspecified fracture of shaft of right tibia, subsequent encounter for open fracture type I or II with routine healing: Secondary | ICD-10-CM | POA: Diagnosis not present

## 2019-04-08 DIAGNOSIS — S52501D Unspecified fracture of the lower end of right radius, subsequent encounter for closed fracture with routine healing: Secondary | ICD-10-CM | POA: Diagnosis not present

## 2019-04-08 DIAGNOSIS — T8189XA Other complications of procedures, not elsewhere classified, initial encounter: Secondary | ICD-10-CM | POA: Diagnosis not present

## 2019-04-08 DIAGNOSIS — S82401E Unspecified fracture of shaft of right fibula, subsequent encounter for open fracture type I or II with routine healing: Secondary | ICD-10-CM | POA: Diagnosis not present

## 2019-04-09 DIAGNOSIS — T8189XA Other complications of procedures, not elsewhere classified, initial encounter: Secondary | ICD-10-CM | POA: Diagnosis not present

## 2019-04-10 DIAGNOSIS — T8189XA Other complications of procedures, not elsewhere classified, initial encounter: Secondary | ICD-10-CM | POA: Diagnosis not present

## 2019-04-11 DIAGNOSIS — T8189XA Other complications of procedures, not elsewhere classified, initial encounter: Secondary | ICD-10-CM | POA: Diagnosis not present

## 2019-04-12 DIAGNOSIS — T8189XA Other complications of procedures, not elsewhere classified, initial encounter: Secondary | ICD-10-CM | POA: Diagnosis not present

## 2019-04-13 DIAGNOSIS — S32048D Other fracture of fourth lumbar vertebra, subsequent encounter for fracture with routine healing: Secondary | ICD-10-CM | POA: Diagnosis not present

## 2019-04-13 DIAGNOSIS — S02412D LeFort II fracture, subsequent encounter for fracture with routine healing: Secondary | ICD-10-CM | POA: Diagnosis not present

## 2019-04-13 DIAGNOSIS — S02411D LeFort I fracture, subsequent encounter for fracture with routine healing: Secondary | ICD-10-CM | POA: Diagnosis not present

## 2019-04-13 DIAGNOSIS — S2241XD Multiple fractures of ribs, right side, subsequent encounter for fracture with routine healing: Secondary | ICD-10-CM | POA: Diagnosis not present

## 2019-04-13 DIAGNOSIS — S92001D Unspecified fracture of right calcaneus, subsequent encounter for fracture with routine healing: Secondary | ICD-10-CM | POA: Diagnosis not present

## 2019-04-13 DIAGNOSIS — S52502D Unspecified fracture of the lower end of left radius, subsequent encounter for closed fracture with routine healing: Secondary | ICD-10-CM | POA: Diagnosis not present

## 2019-04-13 DIAGNOSIS — S82251F Displaced comminuted fracture of shaft of right tibia, subsequent encounter for open fracture type IIIA, IIIB, or IIIC with routine healing: Secondary | ICD-10-CM | POA: Diagnosis not present

## 2019-04-13 DIAGNOSIS — S36116D Major laceration of liver, subsequent encounter: Secondary | ICD-10-CM | POA: Diagnosis not present

## 2019-04-13 DIAGNOSIS — S02413D LeFort III fracture, subsequent encounter for fracture with routine healing: Secondary | ICD-10-CM | POA: Diagnosis not present

## 2019-04-13 DIAGNOSIS — S0292XD Unspecified fracture of facial bones, subsequent encounter for fracture with routine healing: Secondary | ICD-10-CM | POA: Diagnosis not present

## 2019-04-13 DIAGNOSIS — S32028D Other fracture of second lumbar vertebra, subsequent encounter for fracture with routine healing: Secondary | ICD-10-CM | POA: Diagnosis not present

## 2019-04-13 DIAGNOSIS — S62009D Unspecified fracture of navicular [scaphoid] bone of unspecified wrist, subsequent encounter for fracture with routine healing: Secondary | ICD-10-CM | POA: Diagnosis not present

## 2019-04-13 DIAGNOSIS — T8189XA Other complications of procedures, not elsewhere classified, initial encounter: Secondary | ICD-10-CM | POA: Diagnosis not present

## 2019-04-13 DIAGNOSIS — S3210XD Unspecified fracture of sacrum, subsequent encounter for fracture with routine healing: Secondary | ICD-10-CM | POA: Diagnosis not present

## 2019-04-14 DIAGNOSIS — S2242XD Multiple fractures of ribs, left side, subsequent encounter for fracture with routine healing: Secondary | ICD-10-CM | POA: Insufficient documentation

## 2019-04-14 DIAGNOSIS — S36039D Unspecified laceration of spleen, subsequent encounter: Secondary | ICD-10-CM | POA: Insufficient documentation

## 2019-04-14 DIAGNOSIS — R600 Localized edema: Secondary | ICD-10-CM | POA: Diagnosis not present

## 2019-04-14 DIAGNOSIS — T8189XA Other complications of procedures, not elsewhere classified, initial encounter: Secondary | ICD-10-CM | POA: Diagnosis not present

## 2019-04-14 DIAGNOSIS — Z9889 Other specified postprocedural states: Secondary | ICD-10-CM | POA: Diagnosis not present

## 2019-04-14 DIAGNOSIS — T1490XA Injury, unspecified, initial encounter: Secondary | ICD-10-CM | POA: Diagnosis not present

## 2019-04-14 DIAGNOSIS — S270XXA Traumatic pneumothorax, initial encounter: Secondary | ICD-10-CM | POA: Insufficient documentation

## 2019-04-15 DIAGNOSIS — S0292XD Unspecified fracture of facial bones, subsequent encounter for fracture with routine healing: Secondary | ICD-10-CM | POA: Diagnosis not present

## 2019-04-15 DIAGNOSIS — S52501D Unspecified fracture of the lower end of right radius, subsequent encounter for closed fracture with routine healing: Secondary | ICD-10-CM | POA: Diagnosis not present

## 2019-04-15 DIAGNOSIS — S3210XD Unspecified fracture of sacrum, subsequent encounter for fracture with routine healing: Secondary | ICD-10-CM | POA: Diagnosis not present

## 2019-04-15 DIAGNOSIS — S92061D Displaced intraarticular fracture of right calcaneus, subsequent encounter for fracture with routine healing: Secondary | ICD-10-CM | POA: Diagnosis not present

## 2019-04-15 DIAGNOSIS — S02413D LeFort III fracture, subsequent encounter for fracture with routine healing: Secondary | ICD-10-CM | POA: Diagnosis not present

## 2019-04-15 DIAGNOSIS — S52571D Other intraarticular fracture of lower end of right radius, subsequent encounter for closed fracture with routine healing: Secondary | ICD-10-CM | POA: Diagnosis not present

## 2019-04-15 DIAGNOSIS — S32028D Other fracture of second lumbar vertebra, subsequent encounter for fracture with routine healing: Secondary | ICD-10-CM | POA: Diagnosis not present

## 2019-04-15 DIAGNOSIS — Z4789 Encounter for other orthopedic aftercare: Secondary | ICD-10-CM | POA: Diagnosis not present

## 2019-04-15 DIAGNOSIS — S2241XD Multiple fractures of ribs, right side, subsequent encounter for fracture with routine healing: Secondary | ICD-10-CM | POA: Diagnosis not present

## 2019-04-15 DIAGNOSIS — S02412D LeFort II fracture, subsequent encounter for fracture with routine healing: Secondary | ICD-10-CM | POA: Diagnosis not present

## 2019-04-15 DIAGNOSIS — S92812B Other fracture of left foot, initial encounter for open fracture: Secondary | ICD-10-CM | POA: Diagnosis not present

## 2019-04-15 DIAGNOSIS — S32111D Minimally displaced Zone I fracture of sacrum, subsequent encounter for fracture with routine healing: Secondary | ICD-10-CM | POA: Diagnosis not present

## 2019-04-15 DIAGNOSIS — S92312D Displaced fracture of first metatarsal bone, left foot, subsequent encounter for fracture with routine healing: Secondary | ICD-10-CM | POA: Diagnosis not present

## 2019-04-15 DIAGNOSIS — S36116D Major laceration of liver, subsequent encounter: Secondary | ICD-10-CM | POA: Diagnosis not present

## 2019-04-15 DIAGNOSIS — S52502D Unspecified fracture of the lower end of left radius, subsequent encounter for closed fracture with routine healing: Secondary | ICD-10-CM | POA: Diagnosis not present

## 2019-04-15 DIAGNOSIS — S32048D Other fracture of fourth lumbar vertebra, subsequent encounter for fracture with routine healing: Secondary | ICD-10-CM | POA: Diagnosis not present

## 2019-04-15 DIAGNOSIS — S82201D Unspecified fracture of shaft of right tibia, subsequent encounter for closed fracture with routine healing: Secondary | ICD-10-CM | POA: Diagnosis not present

## 2019-04-15 DIAGNOSIS — S62301D Unspecified fracture of second metacarpal bone, left hand, subsequent encounter for fracture with routine healing: Secondary | ICD-10-CM | POA: Diagnosis not present

## 2019-04-15 DIAGNOSIS — S92001B Unspecified fracture of right calcaneus, initial encounter for open fracture: Secondary | ICD-10-CM | POA: Diagnosis not present

## 2019-04-15 DIAGNOSIS — S52611D Displaced fracture of right ulna styloid process, subsequent encounter for closed fracture with routine healing: Secondary | ICD-10-CM | POA: Diagnosis not present

## 2019-04-15 DIAGNOSIS — S92352D Displaced fracture of fifth metatarsal bone, left foot, subsequent encounter for fracture with routine healing: Secondary | ICD-10-CM | POA: Diagnosis not present

## 2019-04-15 DIAGNOSIS — S82451D Displaced comminuted fracture of shaft of right fibula, subsequent encounter for closed fracture with routine healing: Secondary | ICD-10-CM | POA: Diagnosis not present

## 2019-04-15 DIAGNOSIS — S02411D LeFort I fracture, subsequent encounter for fracture with routine healing: Secondary | ICD-10-CM | POA: Diagnosis not present

## 2019-04-15 DIAGNOSIS — S82251F Displaced comminuted fracture of shaft of right tibia, subsequent encounter for open fracture type IIIA, IIIB, or IIIC with routine healing: Secondary | ICD-10-CM | POA: Diagnosis not present

## 2019-04-15 DIAGNOSIS — T8189XA Other complications of procedures, not elsewhere classified, initial encounter: Secondary | ICD-10-CM | POA: Diagnosis not present

## 2019-04-15 DIAGNOSIS — S62009D Unspecified fracture of navicular [scaphoid] bone of unspecified wrist, subsequent encounter for fracture with routine healing: Secondary | ICD-10-CM | POA: Diagnosis not present

## 2019-04-15 DIAGNOSIS — S92001D Unspecified fracture of right calcaneus, subsequent encounter for fracture with routine healing: Secondary | ICD-10-CM | POA: Diagnosis not present

## 2019-04-16 DIAGNOSIS — T8189XA Other complications of procedures, not elsewhere classified, initial encounter: Secondary | ICD-10-CM | POA: Diagnosis not present

## 2019-04-17 DIAGNOSIS — T8189XA Other complications of procedures, not elsewhere classified, initial encounter: Secondary | ICD-10-CM | POA: Diagnosis not present

## 2019-04-18 DIAGNOSIS — S62009D Unspecified fracture of navicular [scaphoid] bone of unspecified wrist, subsequent encounter for fracture with routine healing: Secondary | ICD-10-CM | POA: Diagnosis not present

## 2019-04-18 DIAGNOSIS — S02413D LeFort III fracture, subsequent encounter for fracture with routine healing: Secondary | ICD-10-CM | POA: Diagnosis not present

## 2019-04-18 DIAGNOSIS — S02411D LeFort I fracture, subsequent encounter for fracture with routine healing: Secondary | ICD-10-CM | POA: Diagnosis not present

## 2019-04-18 DIAGNOSIS — S02412D LeFort II fracture, subsequent encounter for fracture with routine healing: Secondary | ICD-10-CM | POA: Diagnosis not present

## 2019-04-18 DIAGNOSIS — T8189XA Other complications of procedures, not elsewhere classified, initial encounter: Secondary | ICD-10-CM | POA: Diagnosis not present

## 2019-04-18 DIAGNOSIS — S92001D Unspecified fracture of right calcaneus, subsequent encounter for fracture with routine healing: Secondary | ICD-10-CM | POA: Diagnosis not present

## 2019-04-18 DIAGNOSIS — S52502D Unspecified fracture of the lower end of left radius, subsequent encounter for closed fracture with routine healing: Secondary | ICD-10-CM | POA: Diagnosis not present

## 2019-04-18 DIAGNOSIS — S2241XD Multiple fractures of ribs, right side, subsequent encounter for fracture with routine healing: Secondary | ICD-10-CM | POA: Diagnosis not present

## 2019-04-18 DIAGNOSIS — S0292XD Unspecified fracture of facial bones, subsequent encounter for fracture with routine healing: Secondary | ICD-10-CM | POA: Diagnosis not present

## 2019-04-18 DIAGNOSIS — S32048D Other fracture of fourth lumbar vertebra, subsequent encounter for fracture with routine healing: Secondary | ICD-10-CM | POA: Diagnosis not present

## 2019-04-18 DIAGNOSIS — S32028D Other fracture of second lumbar vertebra, subsequent encounter for fracture with routine healing: Secondary | ICD-10-CM | POA: Diagnosis not present

## 2019-04-18 DIAGNOSIS — S82251F Displaced comminuted fracture of shaft of right tibia, subsequent encounter for open fracture type IIIA, IIIB, or IIIC with routine healing: Secondary | ICD-10-CM | POA: Diagnosis not present

## 2019-04-18 DIAGNOSIS — S36116D Major laceration of liver, subsequent encounter: Secondary | ICD-10-CM | POA: Diagnosis not present

## 2019-04-18 DIAGNOSIS — S3210XD Unspecified fracture of sacrum, subsequent encounter for fracture with routine healing: Secondary | ICD-10-CM | POA: Diagnosis not present

## 2019-04-19 DIAGNOSIS — T8189XA Other complications of procedures, not elsewhere classified, initial encounter: Secondary | ICD-10-CM | POA: Diagnosis not present

## 2019-04-20 DIAGNOSIS — S82251F Displaced comminuted fracture of shaft of right tibia, subsequent encounter for open fracture type IIIA, IIIB, or IIIC with routine healing: Secondary | ICD-10-CM | POA: Diagnosis not present

## 2019-04-20 DIAGNOSIS — S32028D Other fracture of second lumbar vertebra, subsequent encounter for fracture with routine healing: Secondary | ICD-10-CM | POA: Diagnosis not present

## 2019-04-20 DIAGNOSIS — S0292XD Unspecified fracture of facial bones, subsequent encounter for fracture with routine healing: Secondary | ICD-10-CM | POA: Diagnosis not present

## 2019-04-20 DIAGNOSIS — S92001D Unspecified fracture of right calcaneus, subsequent encounter for fracture with routine healing: Secondary | ICD-10-CM | POA: Diagnosis not present

## 2019-04-20 DIAGNOSIS — S02413D LeFort III fracture, subsequent encounter for fracture with routine healing: Secondary | ICD-10-CM | POA: Diagnosis not present

## 2019-04-20 DIAGNOSIS — S52502D Unspecified fracture of the lower end of left radius, subsequent encounter for closed fracture with routine healing: Secondary | ICD-10-CM | POA: Diagnosis not present

## 2019-04-20 DIAGNOSIS — S36116D Major laceration of liver, subsequent encounter: Secondary | ICD-10-CM | POA: Diagnosis not present

## 2019-04-20 DIAGNOSIS — S02411D LeFort I fracture, subsequent encounter for fracture with routine healing: Secondary | ICD-10-CM | POA: Diagnosis not present

## 2019-04-20 DIAGNOSIS — S32048D Other fracture of fourth lumbar vertebra, subsequent encounter for fracture with routine healing: Secondary | ICD-10-CM | POA: Diagnosis not present

## 2019-04-20 DIAGNOSIS — S02412D LeFort II fracture, subsequent encounter for fracture with routine healing: Secondary | ICD-10-CM | POA: Diagnosis not present

## 2019-04-20 DIAGNOSIS — S62009D Unspecified fracture of navicular [scaphoid] bone of unspecified wrist, subsequent encounter for fracture with routine healing: Secondary | ICD-10-CM | POA: Diagnosis not present

## 2019-04-20 DIAGNOSIS — S2241XD Multiple fractures of ribs, right side, subsequent encounter for fracture with routine healing: Secondary | ICD-10-CM | POA: Diagnosis not present

## 2019-04-20 DIAGNOSIS — T8189XA Other complications of procedures, not elsewhere classified, initial encounter: Secondary | ICD-10-CM | POA: Diagnosis not present

## 2019-04-20 DIAGNOSIS — S3210XD Unspecified fracture of sacrum, subsequent encounter for fracture with routine healing: Secondary | ICD-10-CM | POA: Diagnosis not present

## 2019-04-21 DIAGNOSIS — T8189XA Other complications of procedures, not elsewhere classified, initial encounter: Secondary | ICD-10-CM | POA: Diagnosis not present

## 2019-04-22 DIAGNOSIS — T8189XA Other complications of procedures, not elsewhere classified, initial encounter: Secondary | ICD-10-CM | POA: Diagnosis not present

## 2019-04-23 DIAGNOSIS — T8189XA Other complications of procedures, not elsewhere classified, initial encounter: Secondary | ICD-10-CM | POA: Diagnosis not present

## 2019-04-24 DIAGNOSIS — T8189XA Other complications of procedures, not elsewhere classified, initial encounter: Secondary | ICD-10-CM | POA: Diagnosis not present

## 2019-04-25 DIAGNOSIS — T8189XA Other complications of procedures, not elsewhere classified, initial encounter: Secondary | ICD-10-CM | POA: Diagnosis not present

## 2019-04-26 DIAGNOSIS — S82251F Displaced comminuted fracture of shaft of right tibia, subsequent encounter for open fracture type IIIA, IIIB, or IIIC with routine healing: Secondary | ICD-10-CM | POA: Diagnosis not present

## 2019-04-26 DIAGNOSIS — S62009D Unspecified fracture of navicular [scaphoid] bone of unspecified wrist, subsequent encounter for fracture with routine healing: Secondary | ICD-10-CM | POA: Diagnosis not present

## 2019-04-26 DIAGNOSIS — S92001D Unspecified fracture of right calcaneus, subsequent encounter for fracture with routine healing: Secondary | ICD-10-CM | POA: Diagnosis not present

## 2019-04-26 DIAGNOSIS — S02411D LeFort I fracture, subsequent encounter for fracture with routine healing: Secondary | ICD-10-CM | POA: Diagnosis not present

## 2019-04-26 DIAGNOSIS — S0292XD Unspecified fracture of facial bones, subsequent encounter for fracture with routine healing: Secondary | ICD-10-CM | POA: Diagnosis not present

## 2019-04-26 DIAGNOSIS — S52502D Unspecified fracture of the lower end of left radius, subsequent encounter for closed fracture with routine healing: Secondary | ICD-10-CM | POA: Diagnosis not present

## 2019-04-26 DIAGNOSIS — S32048D Other fracture of fourth lumbar vertebra, subsequent encounter for fracture with routine healing: Secondary | ICD-10-CM | POA: Diagnosis not present

## 2019-04-26 DIAGNOSIS — S02412D LeFort II fracture, subsequent encounter for fracture with routine healing: Secondary | ICD-10-CM | POA: Diagnosis not present

## 2019-04-26 DIAGNOSIS — S32028D Other fracture of second lumbar vertebra, subsequent encounter for fracture with routine healing: Secondary | ICD-10-CM | POA: Diagnosis not present

## 2019-04-26 DIAGNOSIS — S02413D LeFort III fracture, subsequent encounter for fracture with routine healing: Secondary | ICD-10-CM | POA: Diagnosis not present

## 2019-04-26 DIAGNOSIS — S3210XD Unspecified fracture of sacrum, subsequent encounter for fracture with routine healing: Secondary | ICD-10-CM | POA: Diagnosis not present

## 2019-04-26 DIAGNOSIS — S36116D Major laceration of liver, subsequent encounter: Secondary | ICD-10-CM | POA: Diagnosis not present

## 2019-04-26 DIAGNOSIS — S2241XD Multiple fractures of ribs, right side, subsequent encounter for fracture with routine healing: Secondary | ICD-10-CM | POA: Diagnosis not present

## 2019-04-26 DIAGNOSIS — T8189XA Other complications of procedures, not elsewhere classified, initial encounter: Secondary | ICD-10-CM | POA: Diagnosis not present

## 2019-04-27 DIAGNOSIS — T8189XA Other complications of procedures, not elsewhere classified, initial encounter: Secondary | ICD-10-CM | POA: Diagnosis not present

## 2019-04-28 DIAGNOSIS — T8189XA Other complications of procedures, not elsewhere classified, initial encounter: Secondary | ICD-10-CM | POA: Diagnosis not present

## 2019-04-28 DIAGNOSIS — H5213 Myopia, bilateral: Secondary | ICD-10-CM | POA: Diagnosis not present

## 2019-04-28 DIAGNOSIS — S82251J Displaced comminuted fracture of shaft of right tibia, subsequent encounter for open fracture type IIIA, IIIB, or IIIC with delayed healing: Secondary | ICD-10-CM | POA: Diagnosis not present

## 2019-04-28 DIAGNOSIS — S0285XD Fracture of orbit, unspecified, subsequent encounter for fracture with routine healing: Secondary | ICD-10-CM | POA: Diagnosis not present

## 2019-04-28 DIAGNOSIS — Z09 Encounter for follow-up examination after completed treatment for conditions other than malignant neoplasm: Secondary | ICD-10-CM | POA: Diagnosis not present

## 2019-04-28 DIAGNOSIS — Z598 Other problems related to housing and economic circumstances: Secondary | ICD-10-CM | POA: Diagnosis not present

## 2019-04-28 DIAGNOSIS — Z969 Presence of functional implant, unspecified: Secondary | ICD-10-CM | POA: Diagnosis not present

## 2019-04-28 DIAGNOSIS — H52203 Unspecified astigmatism, bilateral: Secondary | ICD-10-CM | POA: Diagnosis not present

## 2019-04-28 DIAGNOSIS — X58XXXD Exposure to other specified factors, subsequent encounter: Secondary | ICD-10-CM | POA: Diagnosis not present

## 2019-04-28 DIAGNOSIS — Z792 Long term (current) use of antibiotics: Secondary | ICD-10-CM | POA: Diagnosis not present

## 2019-04-28 DIAGNOSIS — A498 Other bacterial infections of unspecified site: Secondary | ICD-10-CM | POA: Diagnosis not present

## 2019-04-29 DIAGNOSIS — S32119D Unspecified Zone I fracture of sacrum, subsequent encounter for fracture with routine healing: Secondary | ICD-10-CM | POA: Diagnosis not present

## 2019-04-29 DIAGNOSIS — Z4789 Encounter for other orthopedic aftercare: Secondary | ICD-10-CM | POA: Diagnosis not present

## 2019-04-29 DIAGNOSIS — S92321D Displaced fracture of second metatarsal bone, right foot, subsequent encounter for fracture with routine healing: Secondary | ICD-10-CM | POA: Diagnosis not present

## 2019-04-29 DIAGNOSIS — T8189XA Other complications of procedures, not elsewhere classified, initial encounter: Secondary | ICD-10-CM | POA: Diagnosis not present

## 2019-04-29 DIAGNOSIS — S82251F Displaced comminuted fracture of shaft of right tibia, subsequent encounter for open fracture type IIIA, IIIB, or IIIC with routine healing: Secondary | ICD-10-CM | POA: Diagnosis not present

## 2019-04-29 DIAGNOSIS — S92351D Displaced fracture of fifth metatarsal bone, right foot, subsequent encounter for fracture with routine healing: Secondary | ICD-10-CM | POA: Diagnosis not present

## 2019-04-29 DIAGNOSIS — S52571D Other intraarticular fracture of lower end of right radius, subsequent encounter for closed fracture with routine healing: Secondary | ICD-10-CM | POA: Diagnosis not present

## 2019-04-29 DIAGNOSIS — S82251J Displaced comminuted fracture of shaft of right tibia, subsequent encounter for open fracture type IIIA, IIIB, or IIIC with delayed healing: Secondary | ICD-10-CM | POA: Diagnosis not present

## 2019-04-29 DIAGNOSIS — S92902D Unspecified fracture of left foot, subsequent encounter for fracture with routine healing: Secondary | ICD-10-CM | POA: Diagnosis not present

## 2019-04-29 DIAGNOSIS — S92421D Displaced fracture of distal phalanx of right great toe, subsequent encounter for fracture with routine healing: Secondary | ICD-10-CM | POA: Diagnosis not present

## 2019-04-29 DIAGNOSIS — S92341D Displaced fracture of fourth metatarsal bone, right foot, subsequent encounter for fracture with routine healing: Secondary | ICD-10-CM | POA: Diagnosis not present

## 2019-04-29 DIAGNOSIS — X58XXXD Exposure to other specified factors, subsequent encounter: Secondary | ICD-10-CM | POA: Diagnosis not present

## 2019-04-29 DIAGNOSIS — S92001D Unspecified fracture of right calcaneus, subsequent encounter for fracture with routine healing: Secondary | ICD-10-CM | POA: Diagnosis not present

## 2019-04-29 DIAGNOSIS — M7989 Other specified soft tissue disorders: Secondary | ICD-10-CM | POA: Diagnosis not present

## 2019-04-29 DIAGNOSIS — S92001B Unspecified fracture of right calcaneus, initial encounter for open fracture: Secondary | ICD-10-CM | POA: Diagnosis not present

## 2019-04-29 DIAGNOSIS — S92141D Displaced dome fracture of right talus, subsequent encounter for fracture with routine healing: Secondary | ICD-10-CM | POA: Diagnosis not present

## 2019-04-30 DIAGNOSIS — T8189XA Other complications of procedures, not elsewhere classified, initial encounter: Secondary | ICD-10-CM | POA: Diagnosis not present

## 2019-05-01 DIAGNOSIS — T8189XA Other complications of procedures, not elsewhere classified, initial encounter: Secondary | ICD-10-CM | POA: Diagnosis not present

## 2019-05-02 DIAGNOSIS — T8189XA Other complications of procedures, not elsewhere classified, initial encounter: Secondary | ICD-10-CM | POA: Diagnosis not present

## 2019-05-03 DIAGNOSIS — S62009D Unspecified fracture of navicular [scaphoid] bone of unspecified wrist, subsequent encounter for fracture with routine healing: Secondary | ICD-10-CM | POA: Diagnosis not present

## 2019-05-03 DIAGNOSIS — S92001D Unspecified fracture of right calcaneus, subsequent encounter for fracture with routine healing: Secondary | ICD-10-CM | POA: Diagnosis not present

## 2019-05-03 DIAGNOSIS — S02411D LeFort I fracture, subsequent encounter for fracture with routine healing: Secondary | ICD-10-CM | POA: Diagnosis not present

## 2019-05-03 DIAGNOSIS — S52502D Unspecified fracture of the lower end of left radius, subsequent encounter for closed fracture with routine healing: Secondary | ICD-10-CM | POA: Diagnosis not present

## 2019-05-03 DIAGNOSIS — S02413D LeFort III fracture, subsequent encounter for fracture with routine healing: Secondary | ICD-10-CM | POA: Diagnosis not present

## 2019-05-03 DIAGNOSIS — S32048D Other fracture of fourth lumbar vertebra, subsequent encounter for fracture with routine healing: Secondary | ICD-10-CM | POA: Diagnosis not present

## 2019-05-03 DIAGNOSIS — S2241XD Multiple fractures of ribs, right side, subsequent encounter for fracture with routine healing: Secondary | ICD-10-CM | POA: Diagnosis not present

## 2019-05-03 DIAGNOSIS — S32028D Other fracture of second lumbar vertebra, subsequent encounter for fracture with routine healing: Secondary | ICD-10-CM | POA: Diagnosis not present

## 2019-05-03 DIAGNOSIS — S0292XD Unspecified fracture of facial bones, subsequent encounter for fracture with routine healing: Secondary | ICD-10-CM | POA: Diagnosis not present

## 2019-05-03 DIAGNOSIS — S36116D Major laceration of liver, subsequent encounter: Secondary | ICD-10-CM | POA: Diagnosis not present

## 2019-05-03 DIAGNOSIS — S82251F Displaced comminuted fracture of shaft of right tibia, subsequent encounter for open fracture type IIIA, IIIB, or IIIC with routine healing: Secondary | ICD-10-CM | POA: Diagnosis not present

## 2019-05-03 DIAGNOSIS — S02412D LeFort II fracture, subsequent encounter for fracture with routine healing: Secondary | ICD-10-CM | POA: Diagnosis not present

## 2019-05-03 DIAGNOSIS — S3210XD Unspecified fracture of sacrum, subsequent encounter for fracture with routine healing: Secondary | ICD-10-CM | POA: Diagnosis not present

## 2019-05-05 DIAGNOSIS — S3210XA Unspecified fracture of sacrum, initial encounter for closed fracture: Secondary | ICD-10-CM | POA: Diagnosis not present

## 2019-05-06 DIAGNOSIS — X58XXXD Exposure to other specified factors, subsequent encounter: Secondary | ICD-10-CM | POA: Diagnosis not present

## 2019-05-06 DIAGNOSIS — S92141D Displaced dome fracture of right talus, subsequent encounter for fracture with routine healing: Secondary | ICD-10-CM | POA: Diagnosis not present

## 2019-05-06 DIAGNOSIS — S92061D Displaced intraarticular fracture of right calcaneus, subsequent encounter for fracture with routine healing: Secondary | ICD-10-CM | POA: Diagnosis not present

## 2019-05-06 DIAGNOSIS — S82251J Displaced comminuted fracture of shaft of right tibia, subsequent encounter for open fracture type IIIA, IIIB, or IIIC with delayed healing: Secondary | ICD-10-CM | POA: Diagnosis not present

## 2019-05-06 DIAGNOSIS — Z4789 Encounter for other orthopedic aftercare: Secondary | ICD-10-CM | POA: Diagnosis not present

## 2019-05-06 DIAGNOSIS — M7989 Other specified soft tissue disorders: Secondary | ICD-10-CM | POA: Diagnosis not present

## 2019-05-10 DIAGNOSIS — S32121D Minimally displaced Zone II fracture of sacrum, subsequent encounter for fracture with routine healing: Secondary | ICD-10-CM | POA: Diagnosis not present

## 2019-05-10 DIAGNOSIS — S32029D Unspecified fracture of second lumbar vertebra, subsequent encounter for fracture with routine healing: Secondary | ICD-10-CM | POA: Diagnosis not present

## 2019-05-10 DIAGNOSIS — S32049D Unspecified fracture of fourth lumbar vertebra, subsequent encounter for fracture with routine healing: Secondary | ICD-10-CM | POA: Diagnosis not present

## 2019-05-10 DIAGNOSIS — S32019D Unspecified fracture of first lumbar vertebra, subsequent encounter for fracture with routine healing: Secondary | ICD-10-CM | POA: Diagnosis not present

## 2019-05-10 DIAGNOSIS — S32009D Unspecified fracture of unspecified lumbar vertebra, subsequent encounter for fracture with routine healing: Secondary | ICD-10-CM | POA: Insufficient documentation

## 2019-05-10 DIAGNOSIS — S32040D Wedge compression fracture of fourth lumbar vertebra, subsequent encounter for fracture with routine healing: Secondary | ICD-10-CM | POA: Diagnosis not present

## 2019-05-10 DIAGNOSIS — S32020D Wedge compression fracture of second lumbar vertebra, subsequent encounter for fracture with routine healing: Secondary | ICD-10-CM | POA: Diagnosis not present

## 2019-05-10 DIAGNOSIS — S32010D Wedge compression fracture of first lumbar vertebra, subsequent encounter for fracture with routine healing: Secondary | ICD-10-CM | POA: Diagnosis not present

## 2019-05-12 DIAGNOSIS — Z969 Presence of functional implant, unspecified: Secondary | ICD-10-CM | POA: Diagnosis not present

## 2019-05-12 DIAGNOSIS — A498 Other bacterial infections of unspecified site: Secondary | ICD-10-CM | POA: Diagnosis not present

## 2019-05-12 DIAGNOSIS — R21 Rash and other nonspecific skin eruption: Secondary | ICD-10-CM | POA: Diagnosis not present

## 2019-05-12 DIAGNOSIS — S82251J Displaced comminuted fracture of shaft of right tibia, subsequent encounter for open fracture type IIIA, IIIB, or IIIC with delayed healing: Secondary | ICD-10-CM | POA: Diagnosis not present

## 2019-05-13 DIAGNOSIS — Z1159 Encounter for screening for other viral diseases: Secondary | ICD-10-CM | POA: Diagnosis not present

## 2019-05-13 DIAGNOSIS — X58XXXD Exposure to other specified factors, subsequent encounter: Secondary | ICD-10-CM | POA: Diagnosis not present

## 2019-05-13 DIAGNOSIS — Z01812 Encounter for preprocedural laboratory examination: Secondary | ICD-10-CM | POA: Diagnosis not present

## 2019-05-13 DIAGNOSIS — S82251J Displaced comminuted fracture of shaft of right tibia, subsequent encounter for open fracture type IIIA, IIIB, or IIIC with delayed healing: Secondary | ICD-10-CM | POA: Diagnosis not present

## 2019-05-18 DIAGNOSIS — Y839 Surgical procedure, unspecified as the cause of abnormal reaction of the patient, or of later complication, without mention of misadventure at the time of the procedure: Secondary | ICD-10-CM | POA: Diagnosis not present

## 2019-05-18 DIAGNOSIS — S91301A Unspecified open wound, right foot, initial encounter: Secondary | ICD-10-CM | POA: Diagnosis not present

## 2019-05-18 DIAGNOSIS — T1490XA Injury, unspecified, initial encounter: Secondary | ICD-10-CM | POA: Diagnosis not present

## 2019-05-18 DIAGNOSIS — T8130XA Disruption of wound, unspecified, initial encounter: Secondary | ICD-10-CM | POA: Diagnosis not present

## 2019-05-18 DIAGNOSIS — S81801A Unspecified open wound, right lower leg, initial encounter: Secondary | ICD-10-CM | POA: Diagnosis not present

## 2019-05-18 HISTORY — PX: SKIN GRAFT: SHX250

## 2019-06-04 DIAGNOSIS — S3210XA Unspecified fracture of sacrum, initial encounter for closed fracture: Secondary | ICD-10-CM | POA: Diagnosis not present

## 2019-06-10 DIAGNOSIS — S92902B Unspecified fracture of left foot, initial encounter for open fracture: Secondary | ICD-10-CM | POA: Diagnosis not present

## 2019-06-10 DIAGNOSIS — S82251D Displaced comminuted fracture of shaft of right tibia, subsequent encounter for closed fracture with routine healing: Secondary | ICD-10-CM | POA: Diagnosis not present

## 2019-06-10 DIAGNOSIS — S62031D Displaced fracture of proximal third of navicular [scaphoid] bone of right wrist, subsequent encounter for fracture with routine healing: Secondary | ICD-10-CM | POA: Diagnosis not present

## 2019-06-10 DIAGNOSIS — S92001D Unspecified fracture of right calcaneus, subsequent encounter for fracture with routine healing: Secondary | ICD-10-CM | POA: Diagnosis not present

## 2019-06-28 DIAGNOSIS — Z7409 Other reduced mobility: Secondary | ICD-10-CM | POA: Diagnosis not present

## 2019-06-28 DIAGNOSIS — X58XXXD Exposure to other specified factors, subsequent encounter: Secondary | ICD-10-CM | POA: Diagnosis not present

## 2019-06-28 DIAGNOSIS — S92001D Unspecified fracture of right calcaneus, subsequent encounter for fracture with routine healing: Secondary | ICD-10-CM | POA: Diagnosis not present

## 2019-06-28 DIAGNOSIS — S82251J Displaced comminuted fracture of shaft of right tibia, subsequent encounter for open fracture type IIIA, IIIB, or IIIC with delayed healing: Secondary | ICD-10-CM | POA: Diagnosis not present

## 2019-06-28 DIAGNOSIS — S92902D Unspecified fracture of left foot, subsequent encounter for fracture with routine healing: Secondary | ICD-10-CM | POA: Diagnosis not present

## 2019-06-30 DIAGNOSIS — G8928 Other chronic postprocedural pain: Secondary | ICD-10-CM | POA: Diagnosis not present

## 2019-06-30 DIAGNOSIS — Z09 Encounter for follow-up examination after completed treatment for conditions other than malignant neoplasm: Secondary | ICD-10-CM | POA: Diagnosis not present

## 2019-07-05 DIAGNOSIS — S3210XA Unspecified fracture of sacrum, initial encounter for closed fracture: Secondary | ICD-10-CM | POA: Diagnosis not present

## 2019-07-06 DIAGNOSIS — R29898 Other symptoms and signs involving the musculoskeletal system: Secondary | ICD-10-CM | POA: Diagnosis not present

## 2019-07-06 DIAGNOSIS — S62031S Displaced fracture of proximal third of navicular [scaphoid] bone of right wrist, sequela: Secondary | ICD-10-CM | POA: Diagnosis not present

## 2019-07-06 DIAGNOSIS — T07XXXS Unspecified multiple injuries, sequela: Secondary | ICD-10-CM | POA: Diagnosis not present

## 2019-07-06 DIAGNOSIS — X58XXXS Exposure to other specified factors, sequela: Secondary | ICD-10-CM | POA: Diagnosis not present

## 2019-07-06 DIAGNOSIS — M25631 Stiffness of right wrist, not elsewhere classified: Secondary | ICD-10-CM | POA: Diagnosis not present

## 2019-07-13 DIAGNOSIS — Z792 Long term (current) use of antibiotics: Secondary | ICD-10-CM | POA: Diagnosis not present

## 2019-07-13 DIAGNOSIS — S91301D Unspecified open wound, right foot, subsequent encounter: Secondary | ICD-10-CM | POA: Diagnosis not present

## 2019-07-13 DIAGNOSIS — S92902D Unspecified fracture of left foot, subsequent encounter for fracture with routine healing: Secondary | ICD-10-CM | POA: Diagnosis not present

## 2019-07-13 DIAGNOSIS — R2241 Localized swelling, mass and lump, right lower limb: Secondary | ICD-10-CM | POA: Diagnosis not present

## 2019-07-13 DIAGNOSIS — M7989 Other specified soft tissue disorders: Secondary | ICD-10-CM | POA: Diagnosis not present

## 2019-07-13 DIAGNOSIS — A498 Other bacterial infections of unspecified site: Secondary | ICD-10-CM | POA: Diagnosis not present

## 2019-07-13 DIAGNOSIS — X58XXXD Exposure to other specified factors, subsequent encounter: Secondary | ICD-10-CM | POA: Diagnosis not present

## 2019-07-13 DIAGNOSIS — Z7409 Other reduced mobility: Secondary | ICD-10-CM | POA: Diagnosis not present

## 2019-07-13 DIAGNOSIS — S82251J Displaced comminuted fracture of shaft of right tibia, subsequent encounter for open fracture type IIIA, IIIB, or IIIC with delayed healing: Secondary | ICD-10-CM | POA: Diagnosis not present

## 2019-07-13 DIAGNOSIS — S92001D Unspecified fracture of right calcaneus, subsequent encounter for fracture with routine healing: Secondary | ICD-10-CM | POA: Diagnosis not present

## 2019-07-13 DIAGNOSIS — S82251H Displaced comminuted fracture of shaft of right tibia, subsequent encounter for open fracture type I or II with delayed healing: Secondary | ICD-10-CM | POA: Diagnosis not present

## 2019-07-15 DIAGNOSIS — S82251J Displaced comminuted fracture of shaft of right tibia, subsequent encounter for open fracture type IIIA, IIIB, or IIIC with delayed healing: Secondary | ICD-10-CM | POA: Diagnosis not present

## 2019-07-15 DIAGNOSIS — S92001D Unspecified fracture of right calcaneus, subsequent encounter for fracture with routine healing: Secondary | ICD-10-CM | POA: Diagnosis not present

## 2019-07-15 DIAGNOSIS — X58XXXD Exposure to other specified factors, subsequent encounter: Secondary | ICD-10-CM | POA: Diagnosis not present

## 2019-07-15 DIAGNOSIS — Z7409 Other reduced mobility: Secondary | ICD-10-CM | POA: Diagnosis not present

## 2019-07-15 DIAGNOSIS — S92902D Unspecified fracture of left foot, subsequent encounter for fracture with routine healing: Secondary | ICD-10-CM | POA: Diagnosis not present

## 2019-07-19 DIAGNOSIS — S32009D Unspecified fracture of unspecified lumbar vertebra, subsequent encounter for fracture with routine healing: Secondary | ICD-10-CM | POA: Diagnosis not present

## 2019-07-19 DIAGNOSIS — S52592D Other fractures of lower end of left radius, subsequent encounter for closed fracture with routine healing: Secondary | ICD-10-CM | POA: Diagnosis not present

## 2019-07-19 DIAGNOSIS — M545 Low back pain: Secondary | ICD-10-CM | POA: Diagnosis not present

## 2019-07-19 DIAGNOSIS — X58XXXD Exposure to other specified factors, subsequent encounter: Secondary | ICD-10-CM | POA: Diagnosis not present

## 2019-07-20 DIAGNOSIS — S92902D Unspecified fracture of left foot, subsequent encounter for fracture with routine healing: Secondary | ICD-10-CM | POA: Diagnosis not present

## 2019-07-20 DIAGNOSIS — T07XXXS Unspecified multiple injuries, sequela: Secondary | ICD-10-CM | POA: Diagnosis not present

## 2019-07-20 DIAGNOSIS — X58XXXD Exposure to other specified factors, subsequent encounter: Secondary | ICD-10-CM | POA: Diagnosis not present

## 2019-07-20 DIAGNOSIS — R29898 Other symptoms and signs involving the musculoskeletal system: Secondary | ICD-10-CM | POA: Diagnosis not present

## 2019-07-20 DIAGNOSIS — Z7409 Other reduced mobility: Secondary | ICD-10-CM | POA: Diagnosis not present

## 2019-07-20 DIAGNOSIS — M25631 Stiffness of right wrist, not elsewhere classified: Secondary | ICD-10-CM | POA: Diagnosis not present

## 2019-07-20 DIAGNOSIS — S82251J Displaced comminuted fracture of shaft of right tibia, subsequent encounter for open fracture type IIIA, IIIB, or IIIC with delayed healing: Secondary | ICD-10-CM | POA: Diagnosis not present

## 2019-07-20 DIAGNOSIS — X58XXXS Exposure to other specified factors, sequela: Secondary | ICD-10-CM | POA: Diagnosis not present

## 2019-07-20 DIAGNOSIS — S62031S Displaced fracture of proximal third of navicular [scaphoid] bone of right wrist, sequela: Secondary | ICD-10-CM | POA: Diagnosis not present

## 2019-07-20 DIAGNOSIS — S92001D Unspecified fracture of right calcaneus, subsequent encounter for fracture with routine healing: Secondary | ICD-10-CM | POA: Diagnosis not present

## 2019-07-22 DIAGNOSIS — S82251J Displaced comminuted fracture of shaft of right tibia, subsequent encounter for open fracture type IIIA, IIIB, or IIIC with delayed healing: Secondary | ICD-10-CM | POA: Diagnosis not present

## 2019-07-22 DIAGNOSIS — X58XXXD Exposure to other specified factors, subsequent encounter: Secondary | ICD-10-CM | POA: Diagnosis not present

## 2019-07-22 DIAGNOSIS — Z7409 Other reduced mobility: Secondary | ICD-10-CM | POA: Diagnosis not present

## 2019-07-22 DIAGNOSIS — S92902D Unspecified fracture of left foot, subsequent encounter for fracture with routine healing: Secondary | ICD-10-CM | POA: Diagnosis not present

## 2019-07-22 DIAGNOSIS — S92001D Unspecified fracture of right calcaneus, subsequent encounter for fracture with routine healing: Secondary | ICD-10-CM | POA: Diagnosis not present

## 2019-07-27 DIAGNOSIS — S92001D Unspecified fracture of right calcaneus, subsequent encounter for fracture with routine healing: Secondary | ICD-10-CM | POA: Diagnosis not present

## 2019-07-27 DIAGNOSIS — X58XXXD Exposure to other specified factors, subsequent encounter: Secondary | ICD-10-CM | POA: Diagnosis not present

## 2019-07-27 DIAGNOSIS — S82251J Displaced comminuted fracture of shaft of right tibia, subsequent encounter for open fracture type IIIA, IIIB, or IIIC with delayed healing: Secondary | ICD-10-CM | POA: Diagnosis not present

## 2019-07-27 DIAGNOSIS — S92902D Unspecified fracture of left foot, subsequent encounter for fracture with routine healing: Secondary | ICD-10-CM | POA: Diagnosis not present

## 2019-07-27 DIAGNOSIS — Z7409 Other reduced mobility: Secondary | ICD-10-CM | POA: Diagnosis not present

## 2019-07-29 DIAGNOSIS — S92001D Unspecified fracture of right calcaneus, subsequent encounter for fracture with routine healing: Secondary | ICD-10-CM | POA: Diagnosis not present

## 2019-07-29 DIAGNOSIS — Z7409 Other reduced mobility: Secondary | ICD-10-CM | POA: Diagnosis not present

## 2019-07-29 DIAGNOSIS — S92902D Unspecified fracture of left foot, subsequent encounter for fracture with routine healing: Secondary | ICD-10-CM | POA: Diagnosis not present

## 2019-07-29 DIAGNOSIS — S82251J Displaced comminuted fracture of shaft of right tibia, subsequent encounter for open fracture type IIIA, IIIB, or IIIC with delayed healing: Secondary | ICD-10-CM | POA: Diagnosis not present

## 2019-07-29 DIAGNOSIS — X58XXXD Exposure to other specified factors, subsequent encounter: Secondary | ICD-10-CM | POA: Diagnosis not present

## 2019-08-03 DIAGNOSIS — X58XXXD Exposure to other specified factors, subsequent encounter: Secondary | ICD-10-CM | POA: Diagnosis not present

## 2019-08-03 DIAGNOSIS — Z7409 Other reduced mobility: Secondary | ICD-10-CM | POA: Diagnosis not present

## 2019-08-03 DIAGNOSIS — S82251J Displaced comminuted fracture of shaft of right tibia, subsequent encounter for open fracture type IIIA, IIIB, or IIIC with delayed healing: Secondary | ICD-10-CM | POA: Diagnosis not present

## 2019-08-03 DIAGNOSIS — S92001D Unspecified fracture of right calcaneus, subsequent encounter for fracture with routine healing: Secondary | ICD-10-CM | POA: Diagnosis not present

## 2019-08-03 DIAGNOSIS — S92902D Unspecified fracture of left foot, subsequent encounter for fracture with routine healing: Secondary | ICD-10-CM | POA: Diagnosis not present

## 2019-08-05 DIAGNOSIS — Z7409 Other reduced mobility: Secondary | ICD-10-CM | POA: Diagnosis not present

## 2019-08-05 DIAGNOSIS — S3210XA Unspecified fracture of sacrum, initial encounter for closed fracture: Secondary | ICD-10-CM | POA: Diagnosis not present

## 2019-08-05 DIAGNOSIS — S92001D Unspecified fracture of right calcaneus, subsequent encounter for fracture with routine healing: Secondary | ICD-10-CM | POA: Diagnosis not present

## 2019-08-05 DIAGNOSIS — S92902D Unspecified fracture of left foot, subsequent encounter for fracture with routine healing: Secondary | ICD-10-CM | POA: Diagnosis not present

## 2019-08-05 DIAGNOSIS — S82251J Displaced comminuted fracture of shaft of right tibia, subsequent encounter for open fracture type IIIA, IIIB, or IIIC with delayed healing: Secondary | ICD-10-CM | POA: Diagnosis not present

## 2019-08-05 DIAGNOSIS — X58XXXD Exposure to other specified factors, subsequent encounter: Secondary | ICD-10-CM | POA: Diagnosis not present

## 2019-08-23 DIAGNOSIS — S92001D Unspecified fracture of right calcaneus, subsequent encounter for fracture with routine healing: Secondary | ICD-10-CM | POA: Diagnosis not present

## 2019-08-23 DIAGNOSIS — S92902D Unspecified fracture of left foot, subsequent encounter for fracture with routine healing: Secondary | ICD-10-CM | POA: Diagnosis not present

## 2019-08-23 DIAGNOSIS — Z7409 Other reduced mobility: Secondary | ICD-10-CM | POA: Diagnosis not present

## 2019-08-23 DIAGNOSIS — X58XXXD Exposure to other specified factors, subsequent encounter: Secondary | ICD-10-CM | POA: Diagnosis not present

## 2019-08-23 DIAGNOSIS — S82251J Displaced comminuted fracture of shaft of right tibia, subsequent encounter for open fracture type IIIA, IIIB, or IIIC with delayed healing: Secondary | ICD-10-CM | POA: Diagnosis not present

## 2019-08-26 DIAGNOSIS — S92001D Unspecified fracture of right calcaneus, subsequent encounter for fracture with routine healing: Secondary | ICD-10-CM | POA: Diagnosis not present

## 2019-08-26 DIAGNOSIS — S82251J Displaced comminuted fracture of shaft of right tibia, subsequent encounter for open fracture type IIIA, IIIB, or IIIC with delayed healing: Secondary | ICD-10-CM | POA: Diagnosis not present

## 2019-08-26 DIAGNOSIS — X58XXXD Exposure to other specified factors, subsequent encounter: Secondary | ICD-10-CM | POA: Diagnosis not present

## 2019-08-26 DIAGNOSIS — S92902D Unspecified fracture of left foot, subsequent encounter for fracture with routine healing: Secondary | ICD-10-CM | POA: Diagnosis not present

## 2019-08-26 DIAGNOSIS — Z7409 Other reduced mobility: Secondary | ICD-10-CM | POA: Diagnosis not present

## 2019-08-31 DIAGNOSIS — S92902D Unspecified fracture of left foot, subsequent encounter for fracture with routine healing: Secondary | ICD-10-CM | POA: Diagnosis not present

## 2019-08-31 DIAGNOSIS — S82251J Displaced comminuted fracture of shaft of right tibia, subsequent encounter for open fracture type IIIA, IIIB, or IIIC with delayed healing: Secondary | ICD-10-CM | POA: Diagnosis not present

## 2019-08-31 DIAGNOSIS — S92001D Unspecified fracture of right calcaneus, subsequent encounter for fracture with routine healing: Secondary | ICD-10-CM | POA: Diagnosis not present

## 2019-08-31 DIAGNOSIS — Z7409 Other reduced mobility: Secondary | ICD-10-CM | POA: Diagnosis not present

## 2019-08-31 DIAGNOSIS — X58XXXD Exposure to other specified factors, subsequent encounter: Secondary | ICD-10-CM | POA: Diagnosis not present

## 2019-09-02 DIAGNOSIS — S92902D Unspecified fracture of left foot, subsequent encounter for fracture with routine healing: Secondary | ICD-10-CM | POA: Diagnosis not present

## 2019-09-02 DIAGNOSIS — S82251J Displaced comminuted fracture of shaft of right tibia, subsequent encounter for open fracture type IIIA, IIIB, or IIIC with delayed healing: Secondary | ICD-10-CM | POA: Diagnosis not present

## 2019-09-02 DIAGNOSIS — X58XXXD Exposure to other specified factors, subsequent encounter: Secondary | ICD-10-CM | POA: Diagnosis not present

## 2019-09-02 DIAGNOSIS — Z7409 Other reduced mobility: Secondary | ICD-10-CM | POA: Diagnosis not present

## 2019-09-02 DIAGNOSIS — S92001D Unspecified fracture of right calcaneus, subsequent encounter for fracture with routine healing: Secondary | ICD-10-CM | POA: Diagnosis not present

## 2019-09-05 DIAGNOSIS — L089 Local infection of the skin and subcutaneous tissue, unspecified: Secondary | ICD-10-CM | POA: Diagnosis not present

## 2019-09-05 DIAGNOSIS — M79605 Pain in left leg: Secondary | ICD-10-CM | POA: Diagnosis not present

## 2019-09-05 DIAGNOSIS — M79604 Pain in right leg: Secondary | ICD-10-CM | POA: Diagnosis not present

## 2019-09-05 DIAGNOSIS — F418 Other specified anxiety disorders: Secondary | ICD-10-CM | POA: Diagnosis not present

## 2019-09-07 DIAGNOSIS — Z7409 Other reduced mobility: Secondary | ICD-10-CM | POA: Diagnosis not present

## 2019-09-07 DIAGNOSIS — X58XXXD Exposure to other specified factors, subsequent encounter: Secondary | ICD-10-CM | POA: Diagnosis not present

## 2019-09-07 DIAGNOSIS — S92001D Unspecified fracture of right calcaneus, subsequent encounter for fracture with routine healing: Secondary | ICD-10-CM | POA: Diagnosis not present

## 2019-09-07 DIAGNOSIS — S82251J Displaced comminuted fracture of shaft of right tibia, subsequent encounter for open fracture type IIIA, IIIB, or IIIC with delayed healing: Secondary | ICD-10-CM | POA: Diagnosis not present

## 2019-09-07 DIAGNOSIS — S92902D Unspecified fracture of left foot, subsequent encounter for fracture with routine healing: Secondary | ICD-10-CM | POA: Diagnosis not present

## 2019-09-13 DIAGNOSIS — S82251J Displaced comminuted fracture of shaft of right tibia, subsequent encounter for open fracture type IIIA, IIIB, or IIIC with delayed healing: Secondary | ICD-10-CM | POA: Diagnosis not present

## 2019-09-13 DIAGNOSIS — B965 Pseudomonas (aeruginosa) (mallei) (pseudomallei) as the cause of diseases classified elsewhere: Secondary | ICD-10-CM | POA: Diagnosis not present

## 2019-09-13 DIAGNOSIS — S92352D Displaced fracture of fifth metatarsal bone, left foot, subsequent encounter for fracture with routine healing: Secondary | ICD-10-CM | POA: Diagnosis not present

## 2019-09-13 DIAGNOSIS — Z792 Long term (current) use of antibiotics: Secondary | ICD-10-CM | POA: Diagnosis not present

## 2019-09-13 DIAGNOSIS — S92212D Displaced fracture of cuboid bone of left foot, subsequent encounter for fracture with routine healing: Secondary | ICD-10-CM | POA: Diagnosis not present

## 2019-09-13 DIAGNOSIS — S92101D Unspecified fracture of right talus, subsequent encounter for fracture with routine healing: Secondary | ICD-10-CM | POA: Diagnosis not present

## 2019-09-13 DIAGNOSIS — S82201D Unspecified fracture of shaft of right tibia, subsequent encounter for closed fracture with routine healing: Secondary | ICD-10-CM | POA: Diagnosis not present

## 2019-09-13 DIAGNOSIS — S62001D Unspecified fracture of navicular [scaphoid] bone of right wrist, subsequent encounter for fracture with routine healing: Secondary | ICD-10-CM | POA: Diagnosis not present

## 2019-09-13 DIAGNOSIS — M85871 Other specified disorders of bone density and structure, right ankle and foot: Secondary | ICD-10-CM | POA: Diagnosis not present

## 2019-09-13 DIAGNOSIS — S52611D Displaced fracture of right ulna styloid process, subsequent encounter for closed fracture with routine healing: Secondary | ICD-10-CM | POA: Diagnosis not present

## 2019-09-13 DIAGNOSIS — M868X6 Other osteomyelitis, lower leg: Secondary | ICD-10-CM | POA: Diagnosis not present

## 2019-09-13 DIAGNOSIS — A498 Other bacterial infections of unspecified site: Secondary | ICD-10-CM | POA: Diagnosis not present

## 2019-09-13 DIAGNOSIS — S91301D Unspecified open wound, right foot, subsequent encounter: Secondary | ICD-10-CM | POA: Diagnosis not present

## 2019-09-13 DIAGNOSIS — S92001D Unspecified fracture of right calcaneus, subsequent encounter for fracture with routine healing: Secondary | ICD-10-CM | POA: Diagnosis not present

## 2019-09-13 DIAGNOSIS — S82251F Displaced comminuted fracture of shaft of right tibia, subsequent encounter for open fracture type IIIA, IIIB, or IIIC with routine healing: Secondary | ICD-10-CM | POA: Diagnosis not present

## 2019-09-13 DIAGNOSIS — S82461D Displaced segmental fracture of shaft of right fibula, subsequent encounter for closed fracture with routine healing: Secondary | ICD-10-CM | POA: Diagnosis not present

## 2019-09-13 DIAGNOSIS — S52501D Unspecified fracture of the lower end of right radius, subsequent encounter for closed fracture with routine healing: Secondary | ICD-10-CM | POA: Diagnosis not present

## 2019-09-13 DIAGNOSIS — S82451D Displaced comminuted fracture of shaft of right fibula, subsequent encounter for closed fracture with routine healing: Secondary | ICD-10-CM | POA: Diagnosis not present

## 2019-09-14 DIAGNOSIS — S82251J Displaced comminuted fracture of shaft of right tibia, subsequent encounter for open fracture type IIIA, IIIB, or IIIC with delayed healing: Secondary | ICD-10-CM | POA: Diagnosis not present

## 2019-09-14 DIAGNOSIS — Z01812 Encounter for preprocedural laboratory examination: Secondary | ICD-10-CM | POA: Diagnosis not present

## 2019-09-14 DIAGNOSIS — X58XXXD Exposure to other specified factors, subsequent encounter: Secondary | ICD-10-CM | POA: Diagnosis not present

## 2019-09-14 DIAGNOSIS — Z20828 Contact with and (suspected) exposure to other viral communicable diseases: Secondary | ICD-10-CM | POA: Diagnosis not present

## 2019-09-16 DIAGNOSIS — S82251J Displaced comminuted fracture of shaft of right tibia, subsequent encounter for open fracture type IIIA, IIIB, or IIIC with delayed healing: Secondary | ICD-10-CM | POA: Diagnosis not present

## 2019-09-16 DIAGNOSIS — Z7409 Other reduced mobility: Secondary | ICD-10-CM | POA: Diagnosis not present

## 2019-09-16 DIAGNOSIS — S92001D Unspecified fracture of right calcaneus, subsequent encounter for fracture with routine healing: Secondary | ICD-10-CM | POA: Diagnosis not present

## 2019-09-16 DIAGNOSIS — X58XXXD Exposure to other specified factors, subsequent encounter: Secondary | ICD-10-CM | POA: Diagnosis not present

## 2019-09-16 DIAGNOSIS — S92902D Unspecified fracture of left foot, subsequent encounter for fracture with routine healing: Secondary | ICD-10-CM | POA: Diagnosis not present

## 2019-09-21 DIAGNOSIS — Y839 Surgical procedure, unspecified as the cause of abnormal reaction of the patient, or of later complication, without mention of misadventure at the time of the procedure: Secondary | ICD-10-CM | POA: Diagnosis not present

## 2019-09-21 DIAGNOSIS — T8142XA Infection following a procedure, deep incisional surgical site, initial encounter: Secondary | ICD-10-CM | POA: Diagnosis not present

## 2019-09-21 DIAGNOSIS — L02611 Cutaneous abscess of right foot: Secondary | ICD-10-CM | POA: Diagnosis not present

## 2019-09-21 DIAGNOSIS — T8469XA Infection and inflammatory reaction due to internal fixation device of other site, initial encounter: Secondary | ICD-10-CM | POA: Diagnosis not present

## 2019-09-21 DIAGNOSIS — Z472 Encounter for removal of internal fixation device: Secondary | ICD-10-CM | POA: Diagnosis not present

## 2019-09-21 DIAGNOSIS — S92001S Unspecified fracture of right calcaneus, sequela: Secondary | ICD-10-CM | POA: Diagnosis not present

## 2019-09-21 HISTORY — PX: DEBRIDEMENT  FOOT: SUR387

## 2019-09-28 DIAGNOSIS — A4902 Methicillin resistant Staphylococcus aureus infection, unspecified site: Secondary | ICD-10-CM | POA: Diagnosis not present

## 2019-09-28 DIAGNOSIS — M869 Osteomyelitis, unspecified: Secondary | ICD-10-CM | POA: Diagnosis not present

## 2019-10-03 DIAGNOSIS — G8929 Other chronic pain: Secondary | ICD-10-CM | POA: Diagnosis not present

## 2019-10-03 DIAGNOSIS — G8928 Other chronic postprocedural pain: Secondary | ICD-10-CM | POA: Diagnosis not present

## 2019-10-03 DIAGNOSIS — M25571 Pain in right ankle and joints of right foot: Secondary | ICD-10-CM | POA: Diagnosis not present

## 2019-10-05 DIAGNOSIS — S82251J Displaced comminuted fracture of shaft of right tibia, subsequent encounter for open fracture type IIIA, IIIB, or IIIC with delayed healing: Secondary | ICD-10-CM | POA: Diagnosis not present

## 2019-10-05 DIAGNOSIS — Z7409 Other reduced mobility: Secondary | ICD-10-CM | POA: Diagnosis not present

## 2019-10-05 DIAGNOSIS — X58XXXD Exposure to other specified factors, subsequent encounter: Secondary | ICD-10-CM | POA: Diagnosis not present

## 2019-10-05 DIAGNOSIS — S92902D Unspecified fracture of left foot, subsequent encounter for fracture with routine healing: Secondary | ICD-10-CM | POA: Diagnosis not present

## 2019-10-05 DIAGNOSIS — S92001D Unspecified fracture of right calcaneus, subsequent encounter for fracture with routine healing: Secondary | ICD-10-CM | POA: Diagnosis not present

## 2019-10-06 DIAGNOSIS — R61 Generalized hyperhidrosis: Secondary | ICD-10-CM | POA: Diagnosis not present

## 2019-10-06 DIAGNOSIS — Y838 Other surgical procedures as the cause of abnormal reaction of the patient, or of later complication, without mention of misadventure at the time of the procedure: Secondary | ICD-10-CM | POA: Diagnosis not present

## 2019-10-06 DIAGNOSIS — M868X6 Other osteomyelitis, lower leg: Secondary | ICD-10-CM | POA: Diagnosis not present

## 2019-10-06 DIAGNOSIS — M869 Osteomyelitis, unspecified: Secondary | ICD-10-CM | POA: Diagnosis not present

## 2019-10-06 DIAGNOSIS — Z472 Encounter for removal of internal fixation device: Secondary | ICD-10-CM | POA: Diagnosis not present

## 2019-10-06 DIAGNOSIS — S91301D Unspecified open wound, right foot, subsequent encounter: Secondary | ICD-10-CM | POA: Diagnosis not present

## 2019-10-06 DIAGNOSIS — Z792 Long term (current) use of antibiotics: Secondary | ICD-10-CM | POA: Diagnosis not present

## 2019-10-06 DIAGNOSIS — Z4802 Encounter for removal of sutures: Secondary | ICD-10-CM | POA: Diagnosis not present

## 2019-10-06 DIAGNOSIS — A4902 Methicillin resistant Staphylococcus aureus infection, unspecified site: Secondary | ICD-10-CM | POA: Diagnosis not present

## 2019-10-14 DIAGNOSIS — M869 Osteomyelitis, unspecified: Secondary | ICD-10-CM | POA: Diagnosis not present

## 2019-10-19 DIAGNOSIS — S92902D Unspecified fracture of left foot, subsequent encounter for fracture with routine healing: Secondary | ICD-10-CM | POA: Diagnosis not present

## 2019-10-19 DIAGNOSIS — S92001D Unspecified fracture of right calcaneus, subsequent encounter for fracture with routine healing: Secondary | ICD-10-CM | POA: Diagnosis not present

## 2019-10-19 DIAGNOSIS — S82251J Displaced comminuted fracture of shaft of right tibia, subsequent encounter for open fracture type IIIA, IIIB, or IIIC with delayed healing: Secondary | ICD-10-CM | POA: Diagnosis not present

## 2019-10-19 DIAGNOSIS — X58XXXD Exposure to other specified factors, subsequent encounter: Secondary | ICD-10-CM | POA: Diagnosis not present

## 2019-10-19 DIAGNOSIS — M869 Osteomyelitis, unspecified: Secondary | ICD-10-CM | POA: Diagnosis not present

## 2019-10-19 DIAGNOSIS — Z7409 Other reduced mobility: Secondary | ICD-10-CM | POA: Diagnosis not present

## 2019-10-25 DIAGNOSIS — M85871 Other specified disorders of bone density and structure, right ankle and foot: Secondary | ICD-10-CM | POA: Diagnosis not present

## 2019-10-25 DIAGNOSIS — M85872 Other specified disorders of bone density and structure, left ankle and foot: Secondary | ICD-10-CM | POA: Diagnosis not present

## 2019-10-25 DIAGNOSIS — Z4789 Encounter for other orthopedic aftercare: Secondary | ICD-10-CM | POA: Diagnosis not present

## 2019-10-26 DIAGNOSIS — X58XXXD Exposure to other specified factors, subsequent encounter: Secondary | ICD-10-CM | POA: Diagnosis not present

## 2019-10-26 DIAGNOSIS — S92001D Unspecified fracture of right calcaneus, subsequent encounter for fracture with routine healing: Secondary | ICD-10-CM | POA: Diagnosis not present

## 2019-10-26 DIAGNOSIS — S92902D Unspecified fracture of left foot, subsequent encounter for fracture with routine healing: Secondary | ICD-10-CM | POA: Diagnosis not present

## 2019-10-26 DIAGNOSIS — Z7409 Other reduced mobility: Secondary | ICD-10-CM | POA: Diagnosis not present

## 2019-10-26 DIAGNOSIS — M869 Osteomyelitis, unspecified: Secondary | ICD-10-CM | POA: Diagnosis not present

## 2019-10-26 DIAGNOSIS — S82251J Displaced comminuted fracture of shaft of right tibia, subsequent encounter for open fracture type IIIA, IIIB, or IIIC with delayed healing: Secondary | ICD-10-CM | POA: Diagnosis not present

## 2019-11-07 DIAGNOSIS — M869 Osteomyelitis, unspecified: Secondary | ICD-10-CM | POA: Diagnosis not present

## 2019-11-07 DIAGNOSIS — A4902 Methicillin resistant Staphylococcus aureus infection, unspecified site: Secondary | ICD-10-CM | POA: Diagnosis not present

## 2019-11-07 DIAGNOSIS — Z969 Presence of functional implant, unspecified: Secondary | ICD-10-CM | POA: Diagnosis not present

## 2019-11-07 DIAGNOSIS — X58XXXD Exposure to other specified factors, subsequent encounter: Secondary | ICD-10-CM | POA: Diagnosis not present

## 2019-11-07 DIAGNOSIS — Z792 Long term (current) use of antibiotics: Secondary | ICD-10-CM | POA: Diagnosis not present

## 2019-11-07 DIAGNOSIS — S91301D Unspecified open wound, right foot, subsequent encounter: Secondary | ICD-10-CM | POA: Diagnosis not present

## 2019-11-07 DIAGNOSIS — L304 Erythema intertrigo: Secondary | ICD-10-CM | POA: Diagnosis not present

## 2019-11-07 DIAGNOSIS — R197 Diarrhea, unspecified: Secondary | ICD-10-CM | POA: Diagnosis not present

## 2019-11-11 DIAGNOSIS — S92001D Unspecified fracture of right calcaneus, subsequent encounter for fracture with routine healing: Secondary | ICD-10-CM | POA: Diagnosis not present

## 2019-11-11 DIAGNOSIS — S82251J Displaced comminuted fracture of shaft of right tibia, subsequent encounter for open fracture type IIIA, IIIB, or IIIC with delayed healing: Secondary | ICD-10-CM | POA: Diagnosis not present

## 2019-11-11 DIAGNOSIS — Z7409 Other reduced mobility: Secondary | ICD-10-CM | POA: Diagnosis not present

## 2019-11-11 DIAGNOSIS — X58XXXD Exposure to other specified factors, subsequent encounter: Secondary | ICD-10-CM | POA: Diagnosis not present

## 2019-11-11 DIAGNOSIS — S92902D Unspecified fracture of left foot, subsequent encounter for fracture with routine healing: Secondary | ICD-10-CM | POA: Diagnosis not present

## 2019-11-22 DIAGNOSIS — S91301D Unspecified open wound, right foot, subsequent encounter: Secondary | ICD-10-CM | POA: Diagnosis not present

## 2019-11-22 DIAGNOSIS — X58XXXS Exposure to other specified factors, sequela: Secondary | ICD-10-CM | POA: Diagnosis not present

## 2019-11-22 DIAGNOSIS — S91301S Unspecified open wound, right foot, sequela: Secondary | ICD-10-CM | POA: Diagnosis not present

## 2019-11-23 DIAGNOSIS — S92902D Unspecified fracture of left foot, subsequent encounter for fracture with routine healing: Secondary | ICD-10-CM | POA: Diagnosis not present

## 2019-11-23 DIAGNOSIS — X58XXXD Exposure to other specified factors, subsequent encounter: Secondary | ICD-10-CM | POA: Diagnosis not present

## 2019-11-23 DIAGNOSIS — S82251J Displaced comminuted fracture of shaft of right tibia, subsequent encounter for open fracture type IIIA, IIIB, or IIIC with delayed healing: Secondary | ICD-10-CM | POA: Diagnosis not present

## 2019-11-23 DIAGNOSIS — Z7409 Other reduced mobility: Secondary | ICD-10-CM | POA: Diagnosis not present

## 2019-11-23 DIAGNOSIS — S92001D Unspecified fracture of right calcaneus, subsequent encounter for fracture with routine healing: Secondary | ICD-10-CM | POA: Diagnosis not present

## 2019-11-29 DIAGNOSIS — X58XXXS Exposure to other specified factors, sequela: Secondary | ICD-10-CM | POA: Diagnosis not present

## 2019-11-29 DIAGNOSIS — L97412 Non-pressure chronic ulcer of right heel and midfoot with fat layer exposed: Secondary | ICD-10-CM | POA: Diagnosis not present

## 2019-11-29 DIAGNOSIS — S91301S Unspecified open wound, right foot, sequela: Secondary | ICD-10-CM | POA: Diagnosis not present

## 2019-12-02 DIAGNOSIS — X58XXXD Exposure to other specified factors, subsequent encounter: Secondary | ICD-10-CM | POA: Diagnosis not present

## 2019-12-02 DIAGNOSIS — S92902B Unspecified fracture of left foot, initial encounter for open fracture: Secondary | ICD-10-CM | POA: Diagnosis not present

## 2019-12-02 DIAGNOSIS — S92001B Unspecified fracture of right calcaneus, initial encounter for open fracture: Secondary | ICD-10-CM | POA: Diagnosis not present

## 2019-12-02 DIAGNOSIS — Z7409 Other reduced mobility: Secondary | ICD-10-CM | POA: Diagnosis not present

## 2019-12-02 DIAGNOSIS — S82251J Displaced comminuted fracture of shaft of right tibia, subsequent encounter for open fracture type IIIA, IIIB, or IIIC with delayed healing: Secondary | ICD-10-CM | POA: Diagnosis not present

## 2019-12-02 DIAGNOSIS — X58XXXA Exposure to other specified factors, initial encounter: Secondary | ICD-10-CM | POA: Diagnosis not present

## 2019-12-07 DIAGNOSIS — Z7409 Other reduced mobility: Secondary | ICD-10-CM | POA: Diagnosis not present

## 2019-12-07 DIAGNOSIS — S91301S Unspecified open wound, right foot, sequela: Secondary | ICD-10-CM | POA: Diagnosis not present

## 2019-12-07 DIAGNOSIS — X58XXXA Exposure to other specified factors, initial encounter: Secondary | ICD-10-CM | POA: Diagnosis not present

## 2019-12-07 DIAGNOSIS — X58XXXS Exposure to other specified factors, sequela: Secondary | ICD-10-CM | POA: Diagnosis not present

## 2019-12-07 DIAGNOSIS — S92001B Unspecified fracture of right calcaneus, initial encounter for open fracture: Secondary | ICD-10-CM | POA: Diagnosis not present

## 2019-12-07 DIAGNOSIS — X58XXXD Exposure to other specified factors, subsequent encounter: Secondary | ICD-10-CM | POA: Diagnosis not present

## 2019-12-07 DIAGNOSIS — S82251J Displaced comminuted fracture of shaft of right tibia, subsequent encounter for open fracture type IIIA, IIIB, or IIIC with delayed healing: Secondary | ICD-10-CM | POA: Diagnosis not present

## 2019-12-07 DIAGNOSIS — S91301D Unspecified open wound, right foot, subsequent encounter: Secondary | ICD-10-CM | POA: Diagnosis not present

## 2019-12-07 DIAGNOSIS — R609 Edema, unspecified: Secondary | ICD-10-CM | POA: Diagnosis not present

## 2019-12-07 DIAGNOSIS — S92902B Unspecified fracture of left foot, initial encounter for open fracture: Secondary | ICD-10-CM | POA: Diagnosis not present

## 2019-12-08 DIAGNOSIS — S91309A Unspecified open wound, unspecified foot, initial encounter: Secondary | ICD-10-CM | POA: Diagnosis not present

## 2019-12-13 DIAGNOSIS — A4902 Methicillin resistant Staphylococcus aureus infection, unspecified site: Secondary | ICD-10-CM | POA: Diagnosis not present

## 2019-12-13 DIAGNOSIS — Z969 Presence of functional implant, unspecified: Secondary | ICD-10-CM | POA: Diagnosis not present

## 2019-12-13 DIAGNOSIS — M869 Osteomyelitis, unspecified: Secondary | ICD-10-CM | POA: Diagnosis not present

## 2019-12-13 DIAGNOSIS — S91301D Unspecified open wound, right foot, subsequent encounter: Secondary | ICD-10-CM | POA: Diagnosis not present

## 2019-12-14 DIAGNOSIS — S91301S Unspecified open wound, right foot, sequela: Secondary | ICD-10-CM | POA: Diagnosis not present

## 2019-12-14 DIAGNOSIS — X58XXXD Exposure to other specified factors, subsequent encounter: Secondary | ICD-10-CM | POA: Diagnosis not present

## 2019-12-14 DIAGNOSIS — S82251J Displaced comminuted fracture of shaft of right tibia, subsequent encounter for open fracture type IIIA, IIIB, or IIIC with delayed healing: Secondary | ICD-10-CM | POA: Diagnosis not present

## 2019-12-14 DIAGNOSIS — X58XXXS Exposure to other specified factors, sequela: Secondary | ICD-10-CM | POA: Diagnosis not present

## 2019-12-14 DIAGNOSIS — X58XXXA Exposure to other specified factors, initial encounter: Secondary | ICD-10-CM | POA: Diagnosis not present

## 2019-12-14 DIAGNOSIS — S92001B Unspecified fracture of right calcaneus, initial encounter for open fracture: Secondary | ICD-10-CM | POA: Diagnosis not present

## 2019-12-14 DIAGNOSIS — Z7409 Other reduced mobility: Secondary | ICD-10-CM | POA: Diagnosis not present

## 2019-12-14 DIAGNOSIS — S92902B Unspecified fracture of left foot, initial encounter for open fracture: Secondary | ICD-10-CM | POA: Diagnosis not present

## 2019-12-14 DIAGNOSIS — L97415 Non-pressure chronic ulcer of right heel and midfoot with muscle involvement without evidence of necrosis: Secondary | ICD-10-CM | POA: Diagnosis not present

## 2019-12-16 DIAGNOSIS — X58XXXA Exposure to other specified factors, initial encounter: Secondary | ICD-10-CM | POA: Diagnosis not present

## 2019-12-16 DIAGNOSIS — S82251J Displaced comminuted fracture of shaft of right tibia, subsequent encounter for open fracture type IIIA, IIIB, or IIIC with delayed healing: Secondary | ICD-10-CM | POA: Diagnosis not present

## 2019-12-16 DIAGNOSIS — X58XXXD Exposure to other specified factors, subsequent encounter: Secondary | ICD-10-CM | POA: Diagnosis not present

## 2019-12-16 DIAGNOSIS — S92001B Unspecified fracture of right calcaneus, initial encounter for open fracture: Secondary | ICD-10-CM | POA: Diagnosis not present

## 2019-12-16 DIAGNOSIS — Z7409 Other reduced mobility: Secondary | ICD-10-CM | POA: Diagnosis not present

## 2019-12-16 DIAGNOSIS — S92902B Unspecified fracture of left foot, initial encounter for open fracture: Secondary | ICD-10-CM | POA: Diagnosis not present

## 2019-12-19 DIAGNOSIS — S91301D Unspecified open wound, right foot, subsequent encounter: Secondary | ICD-10-CM | POA: Diagnosis not present

## 2019-12-19 DIAGNOSIS — S91301S Unspecified open wound, right foot, sequela: Secondary | ICD-10-CM | POA: Diagnosis not present

## 2019-12-19 DIAGNOSIS — X58XXXS Exposure to other specified factors, sequela: Secondary | ICD-10-CM | POA: Diagnosis not present

## 2019-12-26 DIAGNOSIS — S91301S Unspecified open wound, right foot, sequela: Secondary | ICD-10-CM | POA: Diagnosis not present

## 2019-12-26 DIAGNOSIS — S91301D Unspecified open wound, right foot, subsequent encounter: Secondary | ICD-10-CM | POA: Diagnosis not present

## 2019-12-26 DIAGNOSIS — M86471 Chronic osteomyelitis with draining sinus, right ankle and foot: Secondary | ICD-10-CM | POA: Diagnosis not present

## 2019-12-27 DIAGNOSIS — S91301S Unspecified open wound, right foot, sequela: Secondary | ICD-10-CM | POA: Diagnosis not present

## 2019-12-27 DIAGNOSIS — S91309A Unspecified open wound, unspecified foot, initial encounter: Secondary | ICD-10-CM | POA: Diagnosis not present

## 2019-12-27 DIAGNOSIS — X58XXXS Exposure to other specified factors, sequela: Secondary | ICD-10-CM | POA: Diagnosis not present

## 2019-12-27 DIAGNOSIS — S91301D Unspecified open wound, right foot, subsequent encounter: Secondary | ICD-10-CM | POA: Diagnosis not present

## 2020-01-02 DIAGNOSIS — S91301S Unspecified open wound, right foot, sequela: Secondary | ICD-10-CM | POA: Diagnosis not present

## 2020-01-02 DIAGNOSIS — S91301D Unspecified open wound, right foot, subsequent encounter: Secondary | ICD-10-CM | POA: Diagnosis not present

## 2020-01-02 DIAGNOSIS — M84475K Pathological fracture, left foot, subsequent encounter for fracture with nonunion: Secondary | ICD-10-CM | POA: Diagnosis not present

## 2020-01-02 DIAGNOSIS — X58XXXS Exposure to other specified factors, sequela: Secondary | ICD-10-CM | POA: Diagnosis not present

## 2020-01-04 DIAGNOSIS — M25571 Pain in right ankle and joints of right foot: Secondary | ICD-10-CM | POA: Diagnosis not present

## 2020-01-04 DIAGNOSIS — G8929 Other chronic pain: Secondary | ICD-10-CM | POA: Diagnosis not present

## 2020-01-04 DIAGNOSIS — G8928 Other chronic postprocedural pain: Secondary | ICD-10-CM | POA: Diagnosis not present

## 2020-01-06 DIAGNOSIS — M868X7 Other osteomyelitis, ankle and foot: Secondary | ICD-10-CM | POA: Diagnosis not present

## 2020-01-06 DIAGNOSIS — M84475K Pathological fracture, left foot, subsequent encounter for fracture with nonunion: Secondary | ICD-10-CM | POA: Diagnosis not present

## 2020-01-06 DIAGNOSIS — L97416 Non-pressure chronic ulcer of right heel and midfoot with bone involvement without evidence of necrosis: Secondary | ICD-10-CM | POA: Diagnosis not present

## 2020-01-06 DIAGNOSIS — Z20822 Contact with and (suspected) exposure to covid-19: Secondary | ICD-10-CM | POA: Diagnosis not present

## 2020-01-06 DIAGNOSIS — Z01812 Encounter for preprocedural laboratory examination: Secondary | ICD-10-CM | POA: Diagnosis not present

## 2020-01-10 DIAGNOSIS — T148XXA Other injury of unspecified body region, initial encounter: Secondary | ICD-10-CM | POA: Diagnosis not present

## 2020-01-10 DIAGNOSIS — L97418 Non-pressure chronic ulcer of right heel and midfoot with other specified severity: Secondary | ICD-10-CM | POA: Diagnosis not present

## 2020-01-10 DIAGNOSIS — M84475K Pathological fracture, left foot, subsequent encounter for fracture with nonunion: Secondary | ICD-10-CM | POA: Diagnosis not present

## 2020-01-10 DIAGNOSIS — Z472 Encounter for removal of internal fixation device: Secondary | ICD-10-CM | POA: Diagnosis not present

## 2020-01-10 DIAGNOSIS — L089 Local infection of the skin and subcutaneous tissue, unspecified: Secondary | ICD-10-CM | POA: Diagnosis not present

## 2020-01-10 DIAGNOSIS — M868X7 Other osteomyelitis, ankle and foot: Secondary | ICD-10-CM | POA: Diagnosis not present

## 2020-01-10 DIAGNOSIS — L97416 Non-pressure chronic ulcer of right heel and midfoot with bone involvement without evidence of necrosis: Secondary | ICD-10-CM | POA: Diagnosis not present

## 2020-01-10 DIAGNOSIS — M84474A Pathological fracture, right foot, initial encounter for fracture: Secondary | ICD-10-CM | POA: Diagnosis not present

## 2020-01-10 HISTORY — PX: FOOT HARDWARE REMOVAL: SHX1661

## 2020-01-13 DIAGNOSIS — S91301D Unspecified open wound, right foot, subsequent encounter: Secondary | ICD-10-CM | POA: Diagnosis not present

## 2020-01-13 DIAGNOSIS — S91301S Unspecified open wound, right foot, sequela: Secondary | ICD-10-CM | POA: Diagnosis not present

## 2020-01-18 DIAGNOSIS — S91301S Unspecified open wound, right foot, sequela: Secondary | ICD-10-CM | POA: Diagnosis not present

## 2020-01-18 DIAGNOSIS — S91301D Unspecified open wound, right foot, subsequent encounter: Secondary | ICD-10-CM | POA: Diagnosis not present

## 2020-01-18 DIAGNOSIS — L97419 Non-pressure chronic ulcer of right heel and midfoot with unspecified severity: Secondary | ICD-10-CM | POA: Diagnosis not present

## 2020-01-24 DIAGNOSIS — S92352A Displaced fracture of fifth metatarsal bone, left foot, initial encounter for closed fracture: Secondary | ICD-10-CM | POA: Diagnosis not present

## 2020-01-24 DIAGNOSIS — T84213A Breakdown (mechanical) of internal fixation device of bones of foot and toes, initial encounter: Secondary | ICD-10-CM | POA: Diagnosis not present

## 2020-01-24 DIAGNOSIS — M19072 Primary osteoarthritis, left ankle and foot: Secondary | ICD-10-CM | POA: Diagnosis not present

## 2020-01-24 DIAGNOSIS — M85871 Other specified disorders of bone density and structure, right ankle and foot: Secondary | ICD-10-CM | POA: Diagnosis not present

## 2020-01-24 DIAGNOSIS — S92001D Unspecified fracture of right calcaneus, subsequent encounter for fracture with routine healing: Secondary | ICD-10-CM | POA: Diagnosis not present

## 2020-01-24 DIAGNOSIS — S92352D Displaced fracture of fifth metatarsal bone, left foot, subsequent encounter for fracture with routine healing: Secondary | ICD-10-CM | POA: Diagnosis not present

## 2020-01-24 DIAGNOSIS — S89301D Unspecified physeal fracture of lower end of right fibula, subsequent encounter for fracture with routine healing: Secondary | ICD-10-CM | POA: Diagnosis not present

## 2020-01-24 DIAGNOSIS — Z4789 Encounter for other orthopedic aftercare: Secondary | ICD-10-CM | POA: Diagnosis not present

## 2020-01-24 DIAGNOSIS — S92001B Unspecified fracture of right calcaneus, initial encounter for open fracture: Secondary | ICD-10-CM | POA: Diagnosis not present

## 2020-01-24 DIAGNOSIS — X58XXXD Exposure to other specified factors, subsequent encounter: Secondary | ICD-10-CM | POA: Diagnosis not present

## 2020-01-24 DIAGNOSIS — S82831A Other fracture of upper and lower end of right fibula, initial encounter for closed fracture: Secondary | ICD-10-CM | POA: Diagnosis not present

## 2020-01-24 DIAGNOSIS — M19071 Primary osteoarthritis, right ankle and foot: Secondary | ICD-10-CM | POA: Diagnosis not present

## 2020-01-24 DIAGNOSIS — S99822D Other specified injuries of left foot, subsequent encounter: Secondary | ICD-10-CM | POA: Diagnosis not present

## 2020-01-24 DIAGNOSIS — S82251J Displaced comminuted fracture of shaft of right tibia, subsequent encounter for open fracture type IIIA, IIIB, or IIIC with delayed healing: Secondary | ICD-10-CM | POA: Diagnosis not present

## 2020-01-24 DIAGNOSIS — S89001D Unspecified physeal fracture of upper end of right tibia, subsequent encounter for fracture with routine healing: Secondary | ICD-10-CM | POA: Diagnosis not present

## 2020-01-24 DIAGNOSIS — Z8781 Personal history of (healed) traumatic fracture: Secondary | ICD-10-CM | POA: Diagnosis not present

## 2020-01-24 DIAGNOSIS — Z9889 Other specified postprocedural states: Secondary | ICD-10-CM | POA: Diagnosis not present

## 2020-01-24 DIAGNOSIS — S82461D Displaced segmental fracture of shaft of right fibula, subsequent encounter for closed fracture with routine healing: Secondary | ICD-10-CM | POA: Diagnosis not present

## 2020-01-24 DIAGNOSIS — S89101D Unspecified physeal fracture of lower end of right tibia, subsequent encounter for fracture with routine healing: Secondary | ICD-10-CM | POA: Diagnosis not present

## 2020-01-24 DIAGNOSIS — S82201D Unspecified fracture of shaft of right tibia, subsequent encounter for closed fracture with routine healing: Secondary | ICD-10-CM | POA: Diagnosis not present

## 2020-01-25 DIAGNOSIS — S91301D Unspecified open wound, right foot, subsequent encounter: Secondary | ICD-10-CM | POA: Diagnosis not present

## 2020-01-25 DIAGNOSIS — S91301S Unspecified open wound, right foot, sequela: Secondary | ICD-10-CM | POA: Diagnosis not present

## 2020-01-25 DIAGNOSIS — X58XXXS Exposure to other specified factors, sequela: Secondary | ICD-10-CM | POA: Diagnosis not present

## 2020-02-01 DIAGNOSIS — X58XXXS Exposure to other specified factors, sequela: Secondary | ICD-10-CM | POA: Diagnosis not present

## 2020-02-01 DIAGNOSIS — S91301S Unspecified open wound, right foot, sequela: Secondary | ICD-10-CM | POA: Diagnosis not present

## 2020-02-01 DIAGNOSIS — S91301D Unspecified open wound, right foot, subsequent encounter: Secondary | ICD-10-CM | POA: Diagnosis not present

## 2020-02-08 DIAGNOSIS — X58XXXS Exposure to other specified factors, sequela: Secondary | ICD-10-CM | POA: Diagnosis not present

## 2020-02-08 DIAGNOSIS — S91301D Unspecified open wound, right foot, subsequent encounter: Secondary | ICD-10-CM | POA: Diagnosis not present

## 2020-02-08 DIAGNOSIS — S91301S Unspecified open wound, right foot, sequela: Secondary | ICD-10-CM | POA: Diagnosis not present

## 2020-02-15 DIAGNOSIS — S91301D Unspecified open wound, right foot, subsequent encounter: Secondary | ICD-10-CM | POA: Diagnosis not present

## 2020-02-15 DIAGNOSIS — S91301S Unspecified open wound, right foot, sequela: Secondary | ICD-10-CM | POA: Diagnosis not present

## 2020-02-15 DIAGNOSIS — X58XXXS Exposure to other specified factors, sequela: Secondary | ICD-10-CM | POA: Diagnosis not present

## 2020-02-15 DIAGNOSIS — L97412 Non-pressure chronic ulcer of right heel and midfoot with fat layer exposed: Secondary | ICD-10-CM | POA: Diagnosis not present

## 2020-02-22 DIAGNOSIS — S91301D Unspecified open wound, right foot, subsequent encounter: Secondary | ICD-10-CM | POA: Diagnosis not present

## 2020-02-29 DIAGNOSIS — M84474K Pathological fracture, right foot, subsequent encounter for fracture with nonunion: Secondary | ICD-10-CM | POA: Diagnosis not present

## 2020-03-07 DIAGNOSIS — S91301S Unspecified open wound, right foot, sequela: Secondary | ICD-10-CM | POA: Diagnosis not present

## 2020-03-07 DIAGNOSIS — Y998 Other external cause status: Secondary | ICD-10-CM | POA: Diagnosis not present

## 2020-03-07 DIAGNOSIS — S91301D Unspecified open wound, right foot, subsequent encounter: Secondary | ICD-10-CM | POA: Diagnosis not present

## 2020-03-22 DIAGNOSIS — S91301S Unspecified open wound, right foot, sequela: Secondary | ICD-10-CM | POA: Diagnosis not present

## 2020-03-22 DIAGNOSIS — L905 Scar conditions and fibrosis of skin: Secondary | ICD-10-CM | POA: Diagnosis not present

## 2020-04-04 DIAGNOSIS — M24571 Contracture, right ankle: Secondary | ICD-10-CM | POA: Diagnosis not present

## 2020-04-04 DIAGNOSIS — M21371 Foot drop, right foot: Secondary | ICD-10-CM | POA: Diagnosis not present

## 2020-04-10 DIAGNOSIS — S92902D Unspecified fracture of left foot, subsequent encounter for fracture with routine healing: Secondary | ICD-10-CM | POA: Diagnosis not present

## 2020-04-10 DIAGNOSIS — S82251J Displaced comminuted fracture of shaft of right tibia, subsequent encounter for open fracture type IIIA, IIIB, or IIIC with delayed healing: Secondary | ICD-10-CM | POA: Diagnosis not present

## 2020-04-10 DIAGNOSIS — M19072 Primary osteoarthritis, left ankle and foot: Secondary | ICD-10-CM | POA: Diagnosis not present

## 2020-04-10 DIAGNOSIS — Z043 Encounter for examination and observation following other accident: Secondary | ICD-10-CM | POA: Diagnosis not present

## 2020-04-10 DIAGNOSIS — Z96698 Presence of other orthopedic joint implants: Secondary | ICD-10-CM | POA: Diagnosis not present

## 2020-04-10 DIAGNOSIS — S82291D Other fracture of shaft of right tibia, subsequent encounter for closed fracture with routine healing: Secondary | ICD-10-CM | POA: Diagnosis not present

## 2020-04-10 DIAGNOSIS — S52501D Unspecified fracture of the lower end of right radius, subsequent encounter for closed fracture with routine healing: Secondary | ICD-10-CM | POA: Diagnosis not present

## 2020-04-10 DIAGNOSIS — Z4789 Encounter for other orthopedic aftercare: Secondary | ICD-10-CM | POA: Diagnosis not present

## 2020-04-10 DIAGNOSIS — S62001D Unspecified fracture of navicular [scaphoid] bone of right wrist, subsequent encounter for fracture with routine healing: Secondary | ICD-10-CM | POA: Diagnosis not present

## 2020-04-10 DIAGNOSIS — S92352D Displaced fracture of fifth metatarsal bone, left foot, subsequent encounter for fracture with routine healing: Secondary | ICD-10-CM | POA: Diagnosis not present

## 2020-04-10 DIAGNOSIS — Z8781 Personal history of (healed) traumatic fracture: Secondary | ICD-10-CM | POA: Diagnosis not present

## 2020-04-10 DIAGNOSIS — S82251E Displaced comminuted fracture of shaft of right tibia, subsequent encounter for open fracture type I or II with routine healing: Secondary | ICD-10-CM | POA: Diagnosis not present

## 2020-04-10 DIAGNOSIS — S52611D Displaced fracture of right ulna styloid process, subsequent encounter for closed fracture with routine healing: Secondary | ICD-10-CM | POA: Diagnosis not present

## 2020-04-10 DIAGNOSIS — S92322D Displaced fracture of second metatarsal bone, left foot, subsequent encounter for fracture with routine healing: Secondary | ICD-10-CM | POA: Diagnosis not present

## 2020-04-10 DIAGNOSIS — S92312D Displaced fracture of first metatarsal bone, left foot, subsequent encounter for fracture with routine healing: Secondary | ICD-10-CM | POA: Diagnosis not present

## 2020-04-10 DIAGNOSIS — X58XXXD Exposure to other specified factors, subsequent encounter: Secondary | ICD-10-CM | POA: Diagnosis not present

## 2020-04-10 DIAGNOSIS — M85861 Other specified disorders of bone density and structure, right lower leg: Secondary | ICD-10-CM | POA: Diagnosis not present

## 2020-04-10 DIAGNOSIS — S92902B Unspecified fracture of left foot, initial encounter for open fracture: Secondary | ICD-10-CM | POA: Diagnosis not present

## 2020-04-10 DIAGNOSIS — S82491D Other fracture of shaft of right fibula, subsequent encounter for closed fracture with routine healing: Secondary | ICD-10-CM | POA: Diagnosis not present

## 2020-04-10 DIAGNOSIS — S92332D Displaced fracture of third metatarsal bone, left foot, subsequent encounter for fracture with routine healing: Secondary | ICD-10-CM | POA: Diagnosis not present

## 2020-04-20 DIAGNOSIS — Z8781 Personal history of (healed) traumatic fracture: Secondary | ICD-10-CM | POA: Diagnosis not present

## 2020-04-20 DIAGNOSIS — M84474K Pathological fracture, right foot, subsequent encounter for fracture with nonunion: Secondary | ICD-10-CM | POA: Diagnosis not present

## 2020-08-13 DIAGNOSIS — F320 Major depressive disorder, single episode, mild: Secondary | ICD-10-CM | POA: Diagnosis not present

## 2020-08-13 DIAGNOSIS — M79604 Pain in right leg: Secondary | ICD-10-CM | POA: Diagnosis not present

## 2020-09-03 ENCOUNTER — Ambulatory Visit: Payer: BC Managed Care – PPO | Admitting: Family Medicine

## 2020-09-03 ENCOUNTER — Encounter: Payer: Self-pay | Admitting: Family Medicine

## 2020-09-03 ENCOUNTER — Ambulatory Visit (INDEPENDENT_AMBULATORY_CARE_PROVIDER_SITE_OTHER)
Admission: RE | Admit: 2020-09-03 | Discharge: 2020-09-03 | Disposition: A | Discharge status: Home / Self Care | Payer: BC Managed Care – PPO | Source: Ambulatory Visit | Attending: Family Medicine | Admitting: Family Medicine

## 2020-09-03 ENCOUNTER — Other Ambulatory Visit: Payer: Self-pay

## 2020-09-03 VITALS — BP 106/80 | HR 84 | Wt 279.0 lb

## 2020-09-03 DIAGNOSIS — M25561 Pain in right knee: Secondary | ICD-10-CM | POA: Diagnosis not present

## 2020-09-03 DIAGNOSIS — M19071 Primary osteoarthritis, right ankle and foot: Secondary | ICD-10-CM | POA: Diagnosis not present

## 2020-09-03 DIAGNOSIS — Z9889 Other specified postprocedural states: Secondary | ICD-10-CM | POA: Diagnosis not present

## 2020-09-03 DIAGNOSIS — R600 Localized edema: Secondary | ICD-10-CM | POA: Diagnosis not present

## 2020-09-03 DIAGNOSIS — S82201A Unspecified fracture of shaft of right tibia, initial encounter for closed fracture: Secondary | ICD-10-CM | POA: Diagnosis not present

## 2020-09-03 DIAGNOSIS — M25471 Effusion, right ankle: Secondary | ICD-10-CM | POA: Diagnosis not present

## 2020-09-03 DIAGNOSIS — S99921A Unspecified injury of right foot, initial encounter: Secondary | ICD-10-CM | POA: Diagnosis not present

## 2020-09-03 DIAGNOSIS — R262 Difficulty in walking, not elsewhere classified: Secondary | ICD-10-CM | POA: Diagnosis not present
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
## 2020-09-03 NOTE — Patient Instructions (Addendum)
Thank you for coming in today.  Get xray now.   Recheck with me in 1 month.   I will order PT likely

## 2020-09-05 NOTE — Progress Notes (Signed)
X-ray tib-fib shows multiple fractures and intact surgical hardware.  He appears to be generally healed

## 2020-09-05 NOTE — Progress Notes (Signed)
X-ray ankle does not show's clear cause of ankle impingement.  Obvious healed fracture and surgery changes present.  PT may be helpful.

## 2020-09-05 NOTE — Progress Notes (Signed)
X-ray foot dominant finding is fracture of calcaneus heel bone.  This has healed but is of course not normal-appearing.

## 2020-09-13 DIAGNOSIS — M25671 Stiffness of right ankle, not elsewhere classified: Secondary | ICD-10-CM | POA: Diagnosis not present

## 2020-09-13 DIAGNOSIS — Z4789 Encounter for other orthopedic aftercare: Secondary | ICD-10-CM | POA: Diagnosis not present

## 2020-09-13 DIAGNOSIS — M79661 Pain in right lower leg: Secondary | ICD-10-CM | POA: Diagnosis not present

## 2020-09-18 DIAGNOSIS — Z4789 Encounter for other orthopedic aftercare: Secondary | ICD-10-CM | POA: Diagnosis not present

## 2020-09-18 DIAGNOSIS — M25671 Stiffness of right ankle, not elsewhere classified: Secondary | ICD-10-CM | POA: Diagnosis not present

## 2020-09-18 DIAGNOSIS — M79661 Pain in right lower leg: Secondary | ICD-10-CM | POA: Diagnosis not present

## 2020-09-20 DIAGNOSIS — M79661 Pain in right lower leg: Secondary | ICD-10-CM | POA: Diagnosis not present

## 2020-09-20 DIAGNOSIS — Z4789 Encounter for other orthopedic aftercare: Secondary | ICD-10-CM | POA: Diagnosis not present

## 2020-09-20 DIAGNOSIS — M25671 Stiffness of right ankle, not elsewhere classified: Secondary | ICD-10-CM | POA: Diagnosis not present

## 2020-09-22 DIAGNOSIS — H40033 Anatomical narrow angle, bilateral: Secondary | ICD-10-CM | POA: Diagnosis not present

## 2020-09-22 DIAGNOSIS — H04123 Dry eye syndrome of bilateral lacrimal glands: Secondary | ICD-10-CM | POA: Diagnosis not present

## 2020-09-24 DIAGNOSIS — M79661 Pain in right lower leg: Secondary | ICD-10-CM | POA: Diagnosis not present

## 2020-09-24 DIAGNOSIS — M25671 Stiffness of right ankle, not elsewhere classified: Secondary | ICD-10-CM | POA: Diagnosis not present

## 2020-09-24 DIAGNOSIS — Z4789 Encounter for other orthopedic aftercare: Secondary | ICD-10-CM | POA: Diagnosis not present

## 2020-09-28 DIAGNOSIS — M25671 Stiffness of right ankle, not elsewhere classified: Secondary | ICD-10-CM | POA: Diagnosis not present

## 2020-09-28 DIAGNOSIS — M79661 Pain in right lower leg: Secondary | ICD-10-CM | POA: Diagnosis not present

## 2020-09-28 DIAGNOSIS — Z4789 Encounter for other orthopedic aftercare: Secondary | ICD-10-CM | POA: Diagnosis not present

## 2020-10-02 DIAGNOSIS — M79671 Pain in right foot: Secondary | ICD-10-CM | POA: Diagnosis not present

## 2020-10-02 DIAGNOSIS — G4720 Circadian rhythm sleep disorder, unspecified type: Secondary | ICD-10-CM | POA: Diagnosis not present
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
## 2020-10-04 ENCOUNTER — Other Ambulatory Visit: Payer: Self-pay

## 2020-10-04 ENCOUNTER — Ambulatory Visit: Payer: BC Managed Care – PPO | Admitting: Family Medicine

## 2020-10-04 VITALS — BP 122/78 | HR 67 | Wt 279.8 lb

## 2020-10-04 DIAGNOSIS — M25561 Pain in right knee: Secondary | ICD-10-CM

## 2020-10-04 DIAGNOSIS — F4310 Post-traumatic stress disorder, unspecified: Secondary | ICD-10-CM

## 2020-10-04 MED ORDER — NORTRIPTYLINE HCL 25 MG PO CAPS: 25.0000 mg | ORAL_CAPSULE | Freq: Every day | ORAL | 2 refills | Status: DC

## 2020-10-04 NOTE — Patient Instructions (Addendum)
Thank you for coming in today.  I am happy to refer to a foot and ankle surgeon about your options.   I will prescribe nortriptyline.Take it at bedtime it should help with pain some.   I think you should consider EMDR therapy for PTSD.

## 2020-10-10 DIAGNOSIS — M79661 Pain in right lower leg: Secondary | ICD-10-CM | POA: Diagnosis not present

## 2020-10-10 DIAGNOSIS — M25571 Pain in right ankle and joints of right foot: Secondary | ICD-10-CM | POA: Diagnosis not present

## 2020-10-24 DIAGNOSIS — M868X6 Other osteomyelitis, lower leg: Secondary | ICD-10-CM | POA: Diagnosis not present

## 2020-10-24 DIAGNOSIS — S82201A Unspecified fracture of shaft of right tibia, initial encounter for closed fracture: Secondary | ICD-10-CM | POA: Diagnosis not present

## 2020-10-24 DIAGNOSIS — M25571 Pain in right ankle and joints of right foot: Secondary | ICD-10-CM | POA: Diagnosis not present

## 2020-10-24 DIAGNOSIS — S92001A Unspecified fracture of right calcaneus, initial encounter for closed fracture: Secondary | ICD-10-CM | POA: Diagnosis not present

## 2020-11-01 ENCOUNTER — Other Ambulatory Visit: Payer: Self-pay | Admitting: Orthopedic Surgery

## 2020-11-01 DIAGNOSIS — S92001A Unspecified fracture of right calcaneus, initial encounter for closed fracture: Secondary | ICD-10-CM

## 2020-11-05 DIAGNOSIS — M79671 Pain in right foot: Secondary | ICD-10-CM | POA: Diagnosis not present

## 2020-11-21 ENCOUNTER — Ambulatory Visit
Admission: RE | Admit: 2020-11-21 | Discharge: 2020-11-21 | Disposition: A | Discharge status: Home / Self Care | Payer: BC Managed Care – PPO | Source: Ambulatory Visit | Attending: Orthopedic Surgery | Admitting: Orthopedic Surgery

## 2020-11-21 DIAGNOSIS — M19071 Primary osteoarthritis, right ankle and foot: Secondary | ICD-10-CM | POA: Diagnosis not present

## 2020-11-21 DIAGNOSIS — S92001A Unspecified fracture of right calcaneus, initial encounter for closed fracture: Secondary | ICD-10-CM | POA: Diagnosis not present

## 2020-11-21 DIAGNOSIS — Z01818 Encounter for other preprocedural examination: Secondary | ICD-10-CM | POA: Diagnosis not present

## 2020-12-04 DIAGNOSIS — S92001P Unspecified fracture of right calcaneus, subsequent encounter for fracture with malunion: Secondary | ICD-10-CM | POA: Diagnosis not present

## 2020-12-04 DIAGNOSIS — L84 Corns and callosities: Secondary | ICD-10-CM | POA: Diagnosis not present

## 2020-12-04 DIAGNOSIS — M19071 Primary osteoarthritis, right ankle and foot: Secondary | ICD-10-CM | POA: Diagnosis not present

## 2020-12-17 DIAGNOSIS — M19071 Primary osteoarthritis, right ankle and foot: Secondary | ICD-10-CM | POA: Diagnosis not present

## 2021-01-14 DIAGNOSIS — M19071 Primary osteoarthritis, right ankle and foot: Secondary | ICD-10-CM | POA: Diagnosis not present

## 2021-05-27 IMAGING — CT CT FOOT*R* W/O CM
2 of 6 series · 5 of 14 positions shown, 6 images · non-contrast
Comparison: None.

CLINICAL DATA: Calcaneal fracture.  Preop evaluation.

EXAM:
CT OF THE RIGHT FOOT WITHOUT CONTRAST
TECHNIQUE: Multidetector CT imaging of the right foot was performed according
to the standard protocol. Multiplanar CT image reconstructions were
also generated.

[Series 1005: axial forefoot bone · axial · 1.48mm/px · z∈[+299,+540]mm · 3 of 191 slices shown, 4 images]
[im 1/191  soft-tissue]
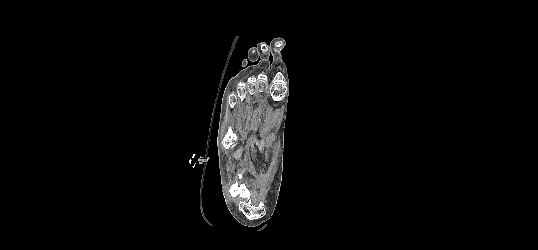
[im 1/191  bone]
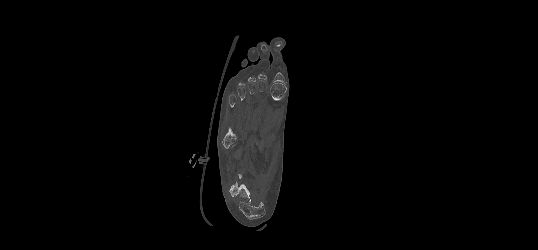
[im 96/191  bone]
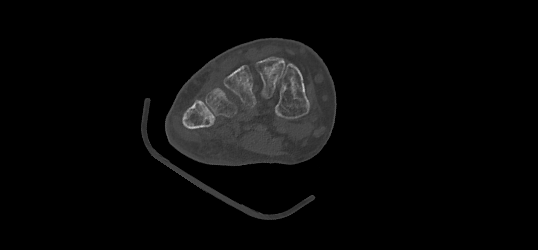
[im 191/191  bone]
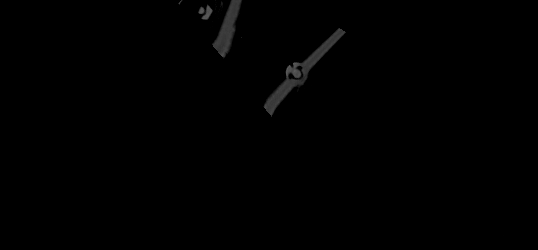

[Series 1015: axial forefoot · coronal · 0.42mm/px · 2 of 202 slices shown]
[im 68/202  bone]
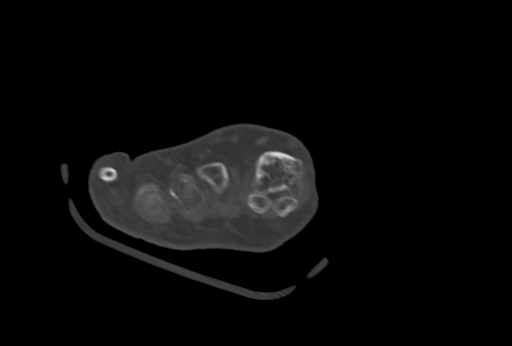
[im 135/202  bone]
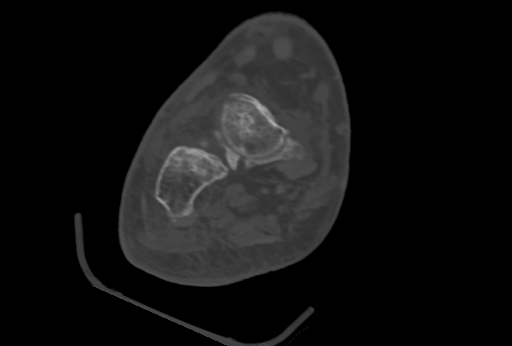

[5 of 14 positions shown; findings below may reference images not displayed]

FINDINGS: Bones/Joint/Cartilage

Chronic comminuted partially united fracture of the mid and
posterior calcaneus with posttraumatic deformity. Healed fracture
deformity of the calcaneus at the articular surface of the posterior
subtalar joint.

Mild osteoarthritis of the talonavicular joint. Mild osteoarthritis
of the tibiotalar joint. Mild osteoarthritis of the calcaneocuboid
joint. Normal alignment. No joint effusion.

Ligaments

Ligaments are suboptimally evaluated by CT.

Muscles and Tendons
Flexor, extensor, peroneal and Achilles tendons are intact. Muscles
are normal.

Soft tissue
No fluid collection or hematoma.  No soft tissue mass.
IMPRESSION: 1. Chronic comminuted partially united fracture of the mid and
posterior calcaneus with posttraumatic deformity. Healed fracture
deformity of the calcaneus at the articular surface of the posterior
subtalar joint.
2. Mild osteoarthritis of the talonavicular joint. Mild
osteoarthritis of the tibiotalar joint. Mild osteoarthritis of the
calcaneocuboid joint.

## 2021-08-05 ENCOUNTER — Other Ambulatory Visit: Payer: Self-pay | Admitting: Orthopaedic Surgery

## 2021-09-02 NOTE — Progress Notes (Signed)
Surgical Instructions    Your procedure is scheduled on Thursday October 13th.  Report to [facility] Main Entrance "A" at 10:30 A.M., then check in with the Admitting office.  Call this number if you have problems the morning of surgery:  [phone]   If you have any questions prior to your surgery date call [phone]: Open Monday-Friday 8am-4pm    Remember:  Do not eat after midnight the night before your surgery  You may drink clear liquids until 9:30am the morning of your surgery.   Clear liquids allowed are: Water, Non-Citrus Juices (without pulp), Carbonated Beverages, Clear Tea, Black Coffee ONLY (NO MILK, CREAM OR POWDERED CREAMER of any kind), and Gatorade   Enhanced Recovery after Surgery for Orthopedics Enhanced Recovery after Surgery is a protocol used to improve the stress on your body and your recovery after surgery.  Patient Instructions  The day of surgery (if you do NOT have diabetes):  Drink ONE (1) Pre-Surgery Clear Ensure by __9:30___ am the morning of surgery   This drink was given to you during your hospital  pre-op appointment visit. Nothing else to drink after completing the  Pre-Surgery Clear Ensure.          If you have questions, please contact your surgeon's office.     Take these medicines the morning of surgery with A SIP OF WATER IF NEEDED  gabapentin (NEURONTIN) 300 MG capsule  As of today, STOP taking any Aspirin (unless otherwise instructed by your surgeon) Aleve, Naproxen, Ibuprofen, Motrin, Advil, Goody's, BC's, all herbal medications, fish oil, and all vitamins.     After your COVID test   You are not required to quarantine however you are required to wear a well-fitting mask when you are out and around people not in your household.  If your mask becomes wet or soiled, replace with a new one.  Wash your hands often with soap and water for 20 seconds or clean your hands with an alcohol-based hand sanitizer that contains at least  60% alcohol.  Do not share personal items.  Notify your provider: if you are in close contact with someone who has COVID  or if you develop a fever of 100.4 or greater, sneezing, cough, sore throat, shortness of breath or body aches.             Do not wear jewelry  Do not wear lotions, powders, colognes, or deodorant. Do not shave 48 hours prior to surgery.  Men may shave face and neck. Do not bring valuables to the hospital. DO Not wear nail polish, gel polish, artificial nails, or any other type of covering on natural nails including finger and toenails. If patients have artificial nails, gel coating, etc. that need to be removed by a nail salon, please have this removed prior to surgery or surgery may need to be canceled/delayed if the surgeon/ anesthesia feels like the patient is unable to be adequately monitored.             [facility] is not responsible for any belongings or valuables.  Do NOT Smoke (Tobacco/Vaping)  24 hours prior to your procedure  If you use a CPAP at night, you may bring your mask for your overnight stay.   Contacts, glasses, hearing aids, dentures or partials may not be worn into surgery, please bring cases for these belongings   For patients admitted to the hospital, discharge time will be determined by your treatment team.   Patients discharged the day  of surgery will not be allowed to drive home, and someone needs to stay with them for 24 hours.  NO VISITORS WILL BE ALLOWED IN PRE-OP WHERE PATIENTS ARE PREPPED FOR SURGERY.  ONLY 1 SUPPORT PERSON MAY BE PRESENT IN THE WAITING ROOM WHILE YOU ARE IN SURGERY.  IF YOU ARE TO BE ADMITTED, ONCE YOU ARE IN YOUR ROOM YOU WILL BE ALLOWED TWO (2) VISITORS. 1 (ONE) VISITOR MAY STAY OVERNIGHT BUT MUST ARRIVE TO THE ROOM BY 8pm.  Minor children may have two parents present. Special consideration for safety and communication needs will be reviewed on a case by case basis.  Special instructions:    Oral Hygiene is  also important to reduce your risk of infection.  Remember - BRUSH YOUR TEETH THE MORNING OF SURGERY WITH YOUR REGULAR TOOTHPASTE   [facility]- Preparing For Surgery  Before surgery, you can play an important role. Because skin is not sterile, your skin needs to be as free of germs as possible. You can reduce the number of germs on your skin by washing with CHG (chlorahexidine gluconate) Soap before surgery.  CHG is an antiseptic cleaner which kills germs and bonds with the skin to continue killing germs even after washing.     Please do not use if you have an allergy to CHG or antibacterial soaps. If your skin becomes reddened/irritated stop using the CHG.  Do not shave (including legs and underarms) for at least 48 hours prior to first CHG shower. It is OK to shave your face.  Please follow these instructions carefully.     Shower the NIGHT BEFORE SURGERY and the MORNING OF SURGERY with CHG Soap.   If you chose to wash your hair, wash your hair first as usual with your normal shampoo. After you shampoo, rinse your hair and body thoroughly to remove the shampoo.  Then [employer] and genitals (private parts) with your normal soap and rinse thoroughly to remove soap.  After that Use CHG Soap as you would any other liquid soap. You can apply CHG directly to the skin and wash gently with a scrungie or a clean washcloth.   Apply the CHG Soap to your body ONLY FROM THE NECK DOWN.  Do not use on open wounds or open sores. Avoid contact with your eyes, ears, mouth and genitals (private parts). Wash Face and genitals (private parts)  with your normal soap.   Wash thoroughly, paying special attention to the area where your surgery will be performed.  Thoroughly rinse your body with warm water from the neck down.  DO NOT shower/wash with your normal soap after using and rinsing off the CHG Soap.  Pat yourself dry with a CLEAN TOWEL.  Wear CLEAN PAJAMAS to bed the night before surgery  Place  CLEAN SHEETS on your bed the night before your surgery  DO NOT SLEEP WITH PETS.   Day of Surgery:  Take a shower with CHG soap. Wear Clean/Comfortable clothing the morning of surgery Do not apply any deodorants/lotions.   Remember to brush your teeth WITH YOUR REGULAR TOOTHPASTE.   Please read over the following fact sheets that you were given.

## 2021-09-03 ENCOUNTER — Other Ambulatory Visit: Payer: Self-pay

## 2021-09-03 ENCOUNTER — Encounter (HOSPITAL_COMMUNITY): Payer: Self-pay

## 2021-09-03 ENCOUNTER — Encounter (HOSPITAL_COMMUNITY)
Admission: RE | Admit: 2021-09-03 | Discharge: 2021-09-03 | Disposition: A | Discharge status: Home / Self Care | Payer: Medicare Other | Source: Ambulatory Visit | Attending: Orthopaedic Surgery | Admitting: Orthopaedic Surgery

## 2021-09-03 DIAGNOSIS — Z01812 Encounter for preprocedural laboratory examination: Secondary | ICD-10-CM | POA: Insufficient documentation

## 2021-09-03 DIAGNOSIS — Z20822 Contact with and (suspected) exposure to covid-19: Secondary | ICD-10-CM | POA: Insufficient documentation

## 2021-09-03 HISTORY — DX: Depression, unspecified: F32.A

## 2021-09-03 HISTORY — DX: Pneumonia, unspecified organism: J18.9

## 2021-09-03 LAB — CBC
HCT: 45.8 % (ref 39.0–52.0)
Hemoglobin: 15.5 g/dL (ref 13.0–17.0)
MCH: 29.8 pg (ref 26.0–34.0)
MCHC: 33.8 g/dL (ref 30.0–36.0)
MCV: 87.9 fL (ref 80.0–100.0)
Platelets: 252 10*3/uL (ref 150–400)
RBC: 5.21 MIL/uL (ref 4.22–5.81)
RDW: 12.9 % (ref 11.5–15.5)
WBC: 11.3 10*3/uL — ABNORMAL HIGH (ref 4.0–10.5)
nRBC: 0 % (ref 0.0–0.2)

## 2021-09-03 LAB — SARS CORONAVIRUS 2 (TAT 6-24 HRS): SARS Coronavirus 2: NEGATIVE

## 2021-09-03 NOTE — Progress Notes (Signed)
PCP - Dr. Merri Brunette  Chest x-ray - Not indicated EKG - Not indicated Stress Test - Denies ECHO - Denies Cardiac Cath - Denies  Sleep Study - Denies    DM - Denies  ERAS Protcol - Yes  PRE-SURGERY Ensure   COVID TEST- 09/03/21   Anesthesia review: No  Patient denies shortness of breath, fever, cough and chest pain at PAT appointment   All instructions explained to the patient, with a verbal understanding of the material. Patient agrees to go over the instructions while at home for a better understanding. Patient also instructed to wear a mask while in public after being tested for COVID-19. The opportunity to ask questions was provided.

## 2021-09-05 ENCOUNTER — Inpatient Hospital Stay (HOSPITAL_COMMUNITY)
Admission: RE | Admit: 2021-09-05 | Discharge: 2021-09-09 | DRG: 476 | Disposition: A | Discharge status: Home / Self Care | Payer: Medicare Other | Attending: Orthopaedic Surgery | Admitting: Orthopaedic Surgery

## 2021-09-05 ENCOUNTER — Other Ambulatory Visit: Payer: Self-pay

## 2021-09-05 ENCOUNTER — Inpatient Hospital Stay (HOSPITAL_COMMUNITY): Payer: Medicare Other | Admitting: Anesthesiology

## 2021-09-05 ENCOUNTER — Inpatient Hospital Stay (HOSPITAL_COMMUNITY): Payer: Medicare Other

## 2021-09-05 ENCOUNTER — Encounter (HOSPITAL_COMMUNITY)
Admission: RE | Disposition: A | Discharge status: Home / Self Care | Payer: Self-pay | Source: Home / Self Care | Attending: Orthopaedic Surgery

## 2021-09-05 ENCOUNTER — Encounter (HOSPITAL_COMMUNITY): Payer: Self-pay | Admitting: Orthopaedic Surgery

## 2021-09-05 DIAGNOSIS — S92001S Unspecified fracture of right calcaneus, sequela: Secondary | ICD-10-CM

## 2021-09-05 DIAGNOSIS — Y831 Surgical operation with implant of artificial internal device as the cause of abnormal reaction of the patient, or of later complication, without mention of misadventure at the time of the procedure: Secondary | ICD-10-CM | POA: Diagnosis present

## 2021-09-05 DIAGNOSIS — F17210 Nicotine dependence, cigarettes, uncomplicated: Secondary | ICD-10-CM | POA: Diagnosis present

## 2021-09-05 DIAGNOSIS — L928 Other granulomatous disorders of the skin and subcutaneous tissue: Secondary | ICD-10-CM | POA: Diagnosis not present

## 2021-09-05 DIAGNOSIS — Z419 Encounter for procedure for purposes other than remedying health state, unspecified: Secondary | ICD-10-CM

## 2021-09-05 DIAGNOSIS — G8921 Chronic pain due to trauma: Secondary | ICD-10-CM | POA: Diagnosis not present

## 2021-09-05 DIAGNOSIS — Z9889 Other specified postprocedural states: Secondary | ICD-10-CM | POA: Diagnosis not present

## 2021-09-05 DIAGNOSIS — S82291S Other fracture of shaft of right tibia, sequela: Secondary | ICD-10-CM | POA: Diagnosis not present

## 2021-09-05 DIAGNOSIS — Y92410 Unspecified street and highway as the place of occurrence of the external cause: Secondary | ICD-10-CM | POA: Diagnosis not present

## 2021-09-05 DIAGNOSIS — M19171 Post-traumatic osteoarthritis, right ankle and foot: Secondary | ICD-10-CM | POA: Diagnosis not present

## 2021-09-05 DIAGNOSIS — Z20822 Contact with and (suspected) exposure to covid-19: Secondary | ICD-10-CM | POA: Diagnosis present

## 2021-09-05 DIAGNOSIS — T8484XA Pain due to internal orthopedic prosthetic devices, implants and grafts, initial encounter: Principal | ICD-10-CM | POA: Diagnosis present

## 2021-09-05 DIAGNOSIS — G8929 Other chronic pain: Secondary | ICD-10-CM | POA: Diagnosis present

## 2021-09-05 DIAGNOSIS — M7989 Other specified soft tissue disorders: Secondary | ICD-10-CM | POA: Diagnosis not present

## 2021-09-05 DIAGNOSIS — S88119A Complete traumatic amputation at level between knee and ankle, unspecified lower leg, initial encounter: Secondary | ICD-10-CM | POA: Diagnosis present

## 2021-09-05 HISTORY — PX: AMPUTATION: SHX166

## 2021-09-05 LAB — CBC
HCT: 39.9 % (ref 39.0–52.0)
Hemoglobin: 13.4 g/dL (ref 13.0–17.0)
MCH: 29.7 pg (ref 26.0–34.0)
MCHC: 33.6 g/dL (ref 30.0–36.0)
MCV: 88.5 fL (ref 80.0–100.0)
Platelets: 263 10*3/uL (ref 150–400)
RBC: 4.51 MIL/uL (ref 4.22–5.81)
RDW: 12.8 % (ref 11.5–15.5)
WBC: 19.8 10*3/uL — ABNORMAL HIGH (ref 4.0–10.5)
nRBC: 0 % (ref 0.0–0.2)

## 2021-09-05 LAB — CREATININE, SERUM
Creatinine, Ser: 0.86 mg/dL (ref 0.61–1.24)
GFR, Estimated: >60 mL/min (ref >60)

## 2021-09-05 SURGERY — AMPUTATION BELOW KNEE
Anesthesia: Regional | Site: Knee | Laterality: Right

## 2021-09-05 MED ORDER — ONDANSETRON HCL 4 MG/2ML IJ SOLN: 4.0000 mg | Freq: Four times a day (QID) | INTRAMUSCULAR | Status: DC | PRN

## 2021-09-05 MED ORDER — ACETAMINOPHEN 325 MG PO TABS
325.0000 mg | ORAL_TABLET | Freq: Four times a day (QID) | ORAL | Status: DC | PRN
  Administered 2021-09-07 – 2021-09-08 (×4): 650 mg via ORAL
  Administered 2021-09-09: 325 mg via ORAL
  Filled 2021-09-05: qty 2
  Filled 2021-09-05: qty 1
  Filled 2021-09-05 (×3): qty 2

## 2021-09-05 MED ORDER — CHLORHEXIDINE GLUCONATE 0.12 % MT SOLN
15.0000 mL | Freq: Once | OROMUCOSAL | Status: AC
  Administered 2021-09-05: 15 mL via OROMUCOSAL
  Filled 2021-09-05: qty 15

## 2021-09-05 MED ORDER — POTASSIUM CHLORIDE CRYS ER 20 MEQ PO TBCR: 20.0000 meq | EXTENDED_RELEASE_TABLET | Freq: Every day | ORAL | Status: DC | PRN

## 2021-09-05 MED ORDER — FENTANYL CITRATE (PF) 250 MCG/5ML IJ SOLN
INTRAMUSCULAR | Status: AC
  Filled 2021-09-05: qty 5

## 2021-09-05 MED ORDER — OXYCODONE HCL 5 MG PO TABS
10.0000 mg | ORAL_TABLET | ORAL | Status: DC | PRN
  Administered 2021-09-06 – 2021-09-07 (×5): 15 mg via ORAL
  Filled 2021-09-05 (×6): qty 3

## 2021-09-05 MED ORDER — OXYCODONE HCL 5 MG PO TABS
5.0000 mg | ORAL_TABLET | Freq: Once | ORAL | Status: AC | PRN
  Administered 2021-09-05: 5 mg via ORAL

## 2021-09-05 MED ORDER — LABETALOL HCL 5 MG/ML IV SOLN: 10.0000 mg | INTRAVENOUS | Status: DC | PRN

## 2021-09-05 MED ORDER — OXYCODONE HCL 5 MG PO TABS
5.0000 mg | ORAL_TABLET | ORAL | Status: DC | PRN
  Administered 2021-09-05: 10 mg via ORAL
  Filled 2021-09-05: qty 2

## 2021-09-05 MED ORDER — MIDAZOLAM HCL 2 MG/2ML IJ SOLN
INTRAMUSCULAR | Status: AC
  Filled 2021-09-05: qty 2

## 2021-09-05 MED ORDER — METOPROLOL TARTRATE 5 MG/5ML IV SOLN: 2.0000 mg | INTRAVENOUS | Status: DC | PRN

## 2021-09-05 MED ORDER — MIDAZOLAM HCL 2 MG/2ML IJ SOLN: 2.0000 mg | Freq: Once | INTRAMUSCULAR | Status: AC

## 2021-09-05 MED ORDER — FENTANYL CITRATE (PF) 100 MCG/2ML IJ SOLN
25.0000 ug | INTRAMUSCULAR | Status: DC | PRN
  Administered 2021-09-05 (×2): 50 ug via INTRAVENOUS

## 2021-09-05 MED ORDER — FENTANYL CITRATE (PF) 100 MCG/2ML IJ SOLN
INTRAMUSCULAR | Status: AC
  Administered 2021-09-05: 100 ug via INTRAVENOUS
  Filled 2021-09-05: qty 2

## 2021-09-05 MED ORDER — DEXTROSE 5 % IV SOLN
INTRAVENOUS | Status: DC | PRN
  Administered 2021-09-05: 3 g via INTRAVENOUS

## 2021-09-05 MED ORDER — PROPOFOL 10 MG/ML IV BOLUS
INTRAVENOUS | Status: AC
  Filled 2021-09-05: qty 20

## 2021-09-05 MED ORDER — ONDANSETRON HCL 4 MG/2ML IJ SOLN
INTRAMUSCULAR | Status: DC | PRN
  Administered 2021-09-05: 4 mg via INTRAVENOUS

## 2021-09-05 MED ORDER — FENTANYL CITRATE (PF) 100 MCG/2ML IJ SOLN
INTRAMUSCULAR | Status: AC
  Filled 2021-09-05: qty 2

## 2021-09-05 MED ORDER — OXYCODONE HCL 5 MG/5ML PO SOLN: 5.0000 mg | Freq: Once | ORAL | Status: AC | PRN

## 2021-09-05 MED ORDER — ORAL CARE MOUTH RINSE: 15.0000 mL | Freq: Once | OROMUCOSAL | Status: AC

## 2021-09-05 MED ORDER — PANTOPRAZOLE SODIUM 40 MG PO TBEC
40.0000 mg | DELAYED_RELEASE_TABLET | Freq: Every day | ORAL | Status: DC
  Administered 2021-09-05 – 2021-09-09 (×5): 40 mg via ORAL
  Filled 2021-09-05 (×5): qty 1

## 2021-09-05 MED ORDER — ACETAMINOPHEN 500 MG PO TABS
1000.0000 mg | ORAL_TABLET | Freq: Four times a day (QID) | ORAL | Status: AC
  Administered 2021-09-05 – 2021-09-06 (×4): 1000 mg via ORAL
  Filled 2021-09-05 (×4): qty 2

## 2021-09-05 MED ORDER — 0.9 % SODIUM CHLORIDE (POUR BTL) OPTIME
TOPICAL | Status: DC | PRN
Start: 2021-09-05 — End: 2021-09-05
  Administered 2021-09-05 (×2): 1000 mL

## 2021-09-05 MED ORDER — DOCUSATE SODIUM 100 MG PO CAPS
100.0000 mg | ORAL_CAPSULE | Freq: Every day | ORAL | Status: DC
  Administered 2021-09-06 – 2021-09-09 (×4): 100 mg via ORAL
  Filled 2021-09-05 (×4): qty 1

## 2021-09-05 MED ORDER — MAGNESIUM SULFATE 2 GM/50ML IV SOLN
2.0000 g | Freq: Every day | INTRAVENOUS | Status: DC | PRN
Start: 2021-09-05 — End: 2021-09-09
  Filled 2021-09-05: qty 50

## 2021-09-05 MED ORDER — ACETAMINOPHEN 10 MG/ML IV SOLN: 1000.0000 mg | Freq: Once | INTRAVENOUS | Status: DC | PRN

## 2021-09-05 MED ORDER — PHENOL 1.4 % MT LIQD: 1.0000 | OROMUCOSAL | Status: DC | PRN

## 2021-09-05 MED ORDER — CEFAZOLIN IN SODIUM CHLORIDE 3-0.9 GM/100ML-% IV SOLN
3.0000 g | INTRAVENOUS | Status: DC
  Filled 2021-09-05: qty 100

## 2021-09-05 MED ORDER — PROPOFOL 10 MG/ML IV BOLUS
INTRAVENOUS | Status: DC | PRN
  Administered 2021-09-05: 300 mg via INTRAVENOUS

## 2021-09-05 MED ORDER — HYDROMORPHONE HCL 1 MG/ML IJ SOLN
0.5000 mg | INTRAMUSCULAR | Status: DC | PRN
  Administered 2021-09-05 – 2021-09-06 (×3): 1 mg via INTRAVENOUS
  Filled 2021-09-05 (×3): qty 1

## 2021-09-05 MED ORDER — MIDAZOLAM HCL 5 MG/5ML IJ SOLN
INTRAMUSCULAR | Status: DC | PRN
  Administered 2021-09-05: 2 mg via INTRAVENOUS

## 2021-09-05 MED ORDER — LIDOCAINE-EPINEPHRINE (PF) 1.5 %-1:200000 IJ SOLN
INTRAMUSCULAR | Status: DC | PRN
  Administered 2021-09-05 (×2): 10 mL via PERINEURAL

## 2021-09-05 MED ORDER — ALUM & MAG HYDROXIDE-SIMETH 200-200-20 MG/5ML PO SUSP: 15.0000 mL | ORAL | Status: DC | PRN

## 2021-09-05 MED ORDER — ACETAMINOPHEN 500 MG PO TABS: 1000.0000 mg | ORAL_TABLET | Freq: Once | ORAL | Status: DC | PRN

## 2021-09-05 MED ORDER — HYDRALAZINE HCL 20 MG/ML IJ SOLN
5.0000 mg | INTRAMUSCULAR | Status: DC | PRN
Start: 2021-09-05 — End: 2021-09-09

## 2021-09-05 MED ORDER — MIDAZOLAM HCL 2 MG/2ML IJ SOLN
INTRAMUSCULAR | Status: AC
  Administered 2021-09-05: 2 mg via INTRAVENOUS
  Filled 2021-09-05: qty 2

## 2021-09-05 MED ORDER — ENOXAPARIN SODIUM 40 MG/0.4ML IJ SOSY
40.0000 mg | PREFILLED_SYRINGE | INTRAMUSCULAR | Status: DC
  Administered 2021-09-06 – 2021-09-09 (×4): 40 mg via SUBCUTANEOUS
  Filled 2021-09-05 (×4): qty 0.4

## 2021-09-05 MED ORDER — ACETAMINOPHEN 160 MG/5ML PO SOLN: 1000.0000 mg | Freq: Once | ORAL | Status: DC | PRN

## 2021-09-05 MED ORDER — FENTANYL CITRATE (PF) 100 MCG/2ML IJ SOLN: 100.0000 ug | Freq: Once | INTRAMUSCULAR | Status: AC

## 2021-09-05 MED ORDER — OXYCODONE HCL 5 MG PO TABS
ORAL_TABLET | ORAL | Status: AC
  Filled 2021-09-05: qty 1

## 2021-09-05 MED ORDER — GUAIFENESIN-DM 100-10 MG/5ML PO SYRP: 15.0000 mL | ORAL_SOLUTION | ORAL | Status: DC | PRN

## 2021-09-05 MED ORDER — LACTATED RINGERS IV SOLN: INTRAVENOUS | Status: DC

## 2021-09-05 MED ORDER — BUPIVACAINE-EPINEPHRINE (PF) 0.5% -1:200000 IJ SOLN
INTRAMUSCULAR | Status: DC | PRN
  Administered 2021-09-05 (×2): 20 mL via PERINEURAL

## 2021-09-05 SURGICAL SUPPLY — 47 items
BAG COUNTER SPONGE SURGICOUNT (BAG) IMPLANT
BLADE SAW RECIP 87.9 MT (BLADE) ×2 IMPLANT
BLADE SAW SGTL HD 18.5X60.5X1. (BLADE) IMPLANT
BLADE SURG 21 STRL SS (BLADE) ×2 IMPLANT
BNDG COHESIVE 6X5 TAN STRL LF (GAUZE/BANDAGES/DRESSINGS) IMPLANT
BNDG ELASTIC 6X10 VLCR STRL LF (GAUZE/BANDAGES/DRESSINGS) ×2 IMPLANT
COVER SURGICAL LIGHT HANDLE (MISCELLANEOUS) ×2 IMPLANT
CUFF TOURN SGL QUICK 34 (TOURNIQUET CUFF)
CUFF TRNQT CYL 34X4.125X (TOURNIQUET CUFF) IMPLANT
DRAPE INCISE IOBAN 66X45 STRL (DRAPES) ×2 IMPLANT
DRAPE U-SHAPE 47X51 STRL (DRAPES) ×2 IMPLANT
DRSG PAD ABDOMINAL 8X10 ST (GAUZE/BANDAGES/DRESSINGS) ×2 IMPLANT
DURAPREP 26ML APPLICATOR (WOUND CARE) ×2 IMPLANT
ELECT REM PT RETURN 9FT ADLT (ELECTROSURGICAL) ×2
ELECTRODE REM PT RTRN 9FT ADLT (ELECTROSURGICAL) ×1 IMPLANT
GAUZE SPONGE 4X4 12PLY STRL (GAUZE/BANDAGES/DRESSINGS) ×2 IMPLANT
GAUZE SPONGE 4X4 12PLY STRL LF (GAUZE/BANDAGES/DRESSINGS) IMPLANT
GAUZE XEROFORM 5X9 LF (GAUZE/BANDAGES/DRESSINGS) ×2 IMPLANT
GLOVE SRG 8 PF TXTR STRL LF DI (GLOVE) ×1 IMPLANT
GLOVE SURG ENC MOIS LTX SZ7.5 (GLOVE) ×2 IMPLANT
GLOVE SURG UNDER POLY LF SZ8 (GLOVE) ×1
GOWN STRL REUS W/ TWL XL LVL3 (GOWN DISPOSABLE) ×2 IMPLANT
GOWN STRL REUS W/TWL XL LVL3 (GOWN DISPOSABLE) ×2
KIT BASIN OR (CUSTOM PROCEDURE TRAY) ×2 IMPLANT
KIT TURNOVER KIT B (KITS) ×2 IMPLANT
MANIFOLD NEPTUNE II (INSTRUMENTS) ×2 IMPLANT
NS IRRIG 1000ML POUR BTL (IV SOLUTION) ×2 IMPLANT
PACK ORTHO EXTREMITY (CUSTOM PROCEDURE TRAY) ×2 IMPLANT
PAD ABD 8X10 STRL (GAUZE/BANDAGES/DRESSINGS) ×2 IMPLANT
PAD ARMBOARD 7.5X6 YLW CONV (MISCELLANEOUS) ×2 IMPLANT
PADDING CAST COTTON 6X4 STRL (CAST SUPPLIES) ×2 IMPLANT
SPONGE T-LAP 18X18 ~~LOC~~+RFID (SPONGE) ×2 IMPLANT
STAPLER VISISTAT 35W (STAPLE) IMPLANT
STOCKINETTE IMPERVIOUS LG (DRAPES) ×2 IMPLANT
SUT ETHILON 2 0 PSLX (SUTURE) ×8 IMPLANT
SUT MON AB 2-0 CT1 36 (SUTURE) ×6 IMPLANT
SUT PDS AB 1 CT  36 (SUTURE) ×1
SUT PDS AB 1 CT 36 (SUTURE) ×1 IMPLANT
SUT PDS AB 2-0 CT1 27 (SUTURE) ×8 IMPLANT
SUT SILK 2 0 (SUTURE) ×1
SUT SILK 2-0 18XBRD TIE 12 (SUTURE) ×1 IMPLANT
SUT VIC AB 1 CTX 27 (SUTURE) ×4 IMPLANT
SUT VIC AB 3-0 SH 27 (SUTURE) ×1
SUT VIC AB 3-0 SH 27X BRD (SUTURE) ×1 IMPLANT
TOWEL GREEN STERILE (TOWEL DISPOSABLE) ×2 IMPLANT
TUBE CONNECTING 12X1/4 (SUCTIONS) ×2 IMPLANT
YANKAUER SUCT BULB TIP NO VENT (SUCTIONS) ×2 IMPLANT

## 2021-09-05 NOTE — Transfer of Care (Signed)
Immediate Anesthesia Transfer of Care Note  Patient: Pierce Barocio  Procedure(s) Performed: DEEP ORTHOPEDIC HARDWARE REMOVAL, RIGHT BELOW THE KNEE AMPUTATION (Right: Knee)  Patient Location: PACU  Anesthesia Type:General  Level of Consciousness: patient cooperative  Airway & Oxygen Therapy: Patient Spontanous Breathing  Post-op Assessment: Report given to RN and Post -op Vital signs reviewed and stable  Post vital signs: Reviewed and stable  Last Vitals:  Vitals Value Taken Time  BP 141/74 09/05/21 1516  Temp 36.5 C 09/05/21 1516  Pulse 80 09/05/21 1520  Resp 13 09/05/21 1520  SpO2 92 % 09/05/21 1520  Vitals shown include unvalidated device data.  Last Pain:  Vitals:   09/05/21 1300  TempSrc:   PainSc: 0-No pain      Patients Stated Pain Goal: 3 (09/05/21 1049)  Complications: No notable events documented.
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
## 2021-09-05 NOTE — Anesthesia Procedure Notes (Signed)
Anesthesia Regional Block: Popliteal block   Pre-Anesthetic Checklist: , timeout performed,  Correct Patient, Correct Site, Correct Laterality,  Correct Procedure, Correct Position, site marked,  Risks and benefits discussed,  Surgical consent,  Pre-op evaluation,  At surgeon's request and post-op pain management  Laterality: Right and Lower  Prep: chloraprep       Needles:  Injection technique: Single-shot      Needle Length: 9cm  Needle Gauge: 22     Additional Needles: Arrow StimuQuik ECHO Echogenic Stimulating PNB Needle  Procedures:,,,, ultrasound used (permanent image in chart),,    Narrative:  Start time: 09/05/2021 12:49 PM End time: 09/05/2021 12:53 PM Injection made incrementally with aspirations every 5 mL.  Performed by: Personally  Anesthesiologist: Val Eagle, MD

## 2021-09-05 NOTE — Plan of Care (Signed)

## 2021-09-05 NOTE — Anesthesia Procedure Notes (Signed)
Anesthesia Regional Block: Femoral nerve block   Pre-Anesthetic Checklist: , timeout performed,  Correct Patient, Correct Site, Correct Laterality,  Correct Procedure, Correct Position, site marked,  Risks and benefits discussed,  Surgical consent,  Pre-op evaluation,  At surgeon's request and post-op pain management  Laterality: Right and Lower  Prep: chloraprep       Needles:  Injection technique: Single-shot      Needle Length: 9cm  Needle Gauge: 22     Additional Needles: Arrow StimuQuik ECHO Echogenic Stimulating PNB Needle  Procedures:,,,, ultrasound used (permanent image in chart),,    Narrative:  Start time: 09/05/2021 12:43 PM End time: 09/05/2021 12:48 PM Injection made incrementally with aspirations every 5 mL.  Performed by: Personally  Anesthesiologist: Val Eagle, MD

## 2021-09-05 NOTE — Anesthesia Preprocedure Evaluation (Addendum)
Anesthesia Evaluation  Patient identified by MRN, date of birth, ID band  Reviewed: Allergy & Precautions, NPO status , Patient's Chart, lab work & pertinent test results  History of Anesthesia Complications Negative for: history of anesthetic complications  Airway Mallampati: II  TM Distance: >3 FB Neck ROM: Full    Dental  (+) Dental Advisory Given, Teeth Intact,    Pulmonary neg shortness of breath, neg sleep apnea, neg COPD, neg recent URI, Current Smoker and Patient abstained from smoking.,    breath sounds clear to auscultation       Cardiovascular negative cardio ROS   Rhythm:Regular     Neuro/Psych PSYCHIATRIC DISORDERS Depression negative neurological ROS     GI/Hepatic negative GI ROS, Neg liver ROS,   Endo/Other    Renal/GU negative Renal ROS     Musculoskeletal RETAINED ORTHOPEDIC HARDWARE, CHRONIC RIGHT LOWER EXTREMITY PAIN   Abdominal   Peds  Hematology negative hematology ROS (+)   Anesthesia Other Findings H/o polytrauma s/p trach with decannulation, site well healed, no evidence of tracheal stenosis  Reproductive/Obstetrics                            Anesthesia Physical Anesthesia Plan  ASA: 1  Anesthesia Plan: General and Regional   Post-op Pain Management:  Regional for Post-op pain   Induction: Intravenous  PONV Risk Score and Plan: 1 and Ondansetron and Dexamethasone  Airway Management Planned: LMA  Additional Equipment: None  Intra-op Plan:   Post-operative Plan: Extubation in OR  Informed Consent: I have reviewed the patients History and Physical, chart, labs and discussed the procedure including the risks, benefits and alternatives for the proposed anesthesia with the patient or authorized representative who has indicated his/her understanding and acceptance.     Dental advisory given  Plan Discussed with: CRNA and Anesthesiologist  Anesthesia Plan  Comments:         Anesthesia Quick Evaluation

## 2021-09-05 NOTE — Anesthesia Procedure Notes (Signed)
Procedure Name: LMA Insertion Date/Time: 09/05/2021 1:12 PM Performed by: Lynnell Chad, CRNA Comments: Performed by CRNA who began case

## 2021-09-06 ENCOUNTER — Encounter (HOSPITAL_COMMUNITY): Payer: Self-pay | Admitting: Orthopaedic Surgery

## 2021-09-06 LAB — CBC
HCT: 37.2 % — ABNORMAL LOW (ref 39.0–52.0)
Hemoglobin: 12.6 g/dL — ABNORMAL LOW (ref 13.0–17.0)
MCH: 30.1 pg (ref 26.0–34.0)
MCHC: 33.9 g/dL (ref 30.0–36.0)
MCV: 89 fL (ref 80.0–100.0)
Platelets: 202 10*3/uL (ref 150–400)
RBC: 4.18 MIL/uL — ABNORMAL LOW (ref 4.22–5.81)
RDW: 13 % (ref 11.5–15.5)
WBC: 13.6 10*3/uL — ABNORMAL HIGH (ref 4.0–10.5)
nRBC: 0 % (ref 0.0–0.2)

## 2021-09-06 LAB — BASIC METABOLIC PANEL
Anion gap: 5 (ref 5–15)
BUN: 10 mg/dL (ref 6–20)
CO2: 29 mmol/L (ref 22–32)
Calcium: 8.6 mg/dL — ABNORMAL LOW (ref 8.9–10.3)
Chloride: 104 mmol/L (ref 98–111)
Creatinine, Ser: 0.84 mg/dL (ref 0.61–1.24)
GFR, Estimated: >60 mL/min (ref >60)
Glucose, Bld: 92 mg/dL (ref 70–99)
Potassium: 3.8 mmol/L (ref 3.5–5.1)
Sodium: 138 mmol/L (ref 135–145)

## 2021-09-06 MED ORDER — GABAPENTIN 300 MG PO CAPS
300.0000 mg | ORAL_CAPSULE | Freq: Three times a day (TID) | ORAL | Status: DC
  Administered 2021-09-06 – 2021-09-08 (×7): 300 mg via ORAL
  Filled 2021-09-06 (×7): qty 1

## 2021-09-06 MED ORDER — NAPROXEN SODIUM 275 MG PO TABS
550.0000 mg | ORAL_TABLET | Freq: Two times a day (BID) | ORAL | Status: DC
  Administered 2021-09-06 – 2021-09-09 (×7): 550 mg via ORAL
  Filled 2021-09-06 (×8): qty 2

## 2021-09-06 MED ORDER — HYDROMORPHONE HCL 1 MG/ML IJ SOLN
0.5000 mg | INTRAMUSCULAR | Status: DC | PRN
  Administered 2021-09-06 – 2021-09-07 (×12): 1 mg via INTRAVENOUS
  Filled 2021-09-06 (×13): qty 1

## 2021-09-06 MED ORDER — MELATONIN 5 MG PO TABS
10.0000 mg | ORAL_TABLET | Freq: Every day | ORAL | Status: DC
  Administered 2021-09-06 – 2021-09-08 (×3): 10 mg via ORAL
  Filled 2021-09-06 (×3): qty 2

## 2021-09-06 MED ORDER — MIRTAZAPINE 15 MG PO TABS
15.0000 mg | ORAL_TABLET | Freq: Every day | ORAL | Status: DC
  Administered 2021-09-06 – 2021-09-08 (×3): 15 mg via ORAL
  Filled 2021-09-06 (×3): qty 1

## 2021-09-06 MED ORDER — NORTRIPTYLINE HCL 25 MG PO CAPS: 25.0000 mg | ORAL_CAPSULE | Freq: Every day | ORAL | Status: DC
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
## 2021-09-06 NOTE — Progress Notes (Signed)
     Warren Hess is a 30 y.o. male   Orthopaedic diagnosis:status post deep hardware removal and right below the knee rotation  Subjective: Patient is in pain.  The block wore off.  We are adjusting pain medication.  Objectyive: Vitals:   09/05/21 2133 09/06/21 0756  BP: 98/62 117/65  Pulse: 65 78  Resp: 18 17  Temp: 98.3 F (36.8 C) 98.6 F (37 C)  SpO2: 96% 94%     Exam: Awake and alert Respirations even and unlabored No acute distress  Right lower leg stump with dressing in place.  Some shadowing laterally and posteriorly.  No active bleeding noted.  He is able to range the knee.  Assessment: Postop day 1 status post above, doing well   Plan: We will adjust pain medication.  I have reordered his home medication including gabapentin.  He was placed on an anti-inflammatory and his dosing of Dilaudid has been increased.  He will work with therapy today in the postoperative amputation pathway has been started.  Patient will likely be discharged home but we will have rehab evaluate him and discuss this as a potential.  Finish perioperative antibiotics Regular diet Pain control Sutures will be removed when appropriate and will begin the stump shrinker process at that point.   Nicki Gu[place], MD
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
## 2021-09-06 NOTE — Progress Notes (Signed)
Inpatient Rehab Admissions Coordinator:   CIR consult received. PT is recommending no follow up and Pt. Has no SLP needs. (OT note pending). Without PT needs, pt. Will not demonstrate need in 2 or more disciplines. CIR will sign off.   Megan Salon, MS, CCC-SLP Rehab Admissions Coordinator  [phone] (celll) [phone] (office)
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
## 2021-09-07 LAB — CBC
HCT: 38.3 % — ABNORMAL LOW (ref 39.0–52.0)
Hemoglobin: 13 g/dL (ref 13.0–17.0)
MCH: 30 pg (ref 26.0–34.0)
MCHC: 33.9 g/dL (ref 30.0–36.0)
MCV: 88.2 fL (ref 80.0–100.0)
Platelets: 191 10*3/uL (ref 150–400)
RBC: 4.34 MIL/uL (ref 4.22–5.81)
RDW: 12.8 % (ref 11.5–15.5)
WBC: 12.4 10*3/uL — ABNORMAL HIGH (ref 4.0–10.5)
nRBC: 0 % (ref 0.0–0.2)

## 2021-09-07 LAB — BASIC METABOLIC PANEL
Anion gap: 8 (ref 5–15)
BUN: 8 mg/dL (ref 6–20)
CO2: 26 mmol/L (ref 22–32)
Calcium: 8.8 mg/dL — ABNORMAL LOW (ref 8.9–10.3)
Chloride: 102 mmol/L (ref 98–111)
Creatinine, Ser: 0.69 mg/dL (ref 0.61–1.24)
GFR, Estimated: >60 mL/min (ref >60)
Glucose, Bld: 104 mg/dL — ABNORMAL HIGH (ref 70–99)
Potassium: 3.8 mmol/L (ref 3.5–5.1)
Sodium: 136 mmol/L (ref 135–145)

## 2021-09-07 MED ORDER — OXYCODONE HCL 5 MG PO TABS
5.0000 mg | ORAL_TABLET | ORAL | Status: DC | PRN
  Administered 2021-09-07 – 2021-09-09 (×9): 10 mg via ORAL
  Filled 2021-09-07 (×9): qty 2

## 2021-09-07 MED ORDER — HYDROMORPHONE HCL 1 MG/ML IJ SOLN
1.0000 mg | INTRAMUSCULAR | Status: DC | PRN
  Administered 2021-09-07 – 2021-09-09 (×11): 2 mg via INTRAVENOUS
  Filled 2021-09-07 (×11): qty 2

## 2021-09-07 NOTE — Progress Notes (Signed)
Subjective: 2 Days Post-Op Procedure(s) (LRB): DEEP ORTHOPEDIC HARDWARE REMOVAL, RIGHT BELOW THE KNEE AMPUTATION (Right) Patient reports pain as moderate.  Doing better with adjustment of pain meds.  Sitting up on side of bed.  Objective: Vital signs in last 24 hours: Temp:  [99.2 F (37.3 C)-99.4 F (37.4 C)] 99.4 F (37.4 C) (10/15 0700) Pulse Rate:  [71-82] 82 (10/15 0700) Resp:  [17-18] 18 (10/15 0700) BP: (116-130)/(62-76) 116/62 (10/15 0700) SpO2:  [96 %-97 %] 96 % (10/15 0700)  Intake/Output from previous day: 10/14 0701 - 10/15 0700 In: 120 [P.O.:120] Out: 750 [Urine:750] Intake/Output this shift: Total I/O In: 360 [P.O.:360] Out: -   Recent Labs    09/05/21 1837 09/06/21 0237 09/07/21 0151  HGB 13.4 12.6* 13.0   Recent Labs    09/06/21 0237 09/07/21 0151  WBC 13.6* 12.4*  RBC 4.18* 4.34  HCT 37.2* 38.3*  PLT 202 191   Recent Labs    09/06/21 0237 09/07/21 0151  NA 138 136  K 3.8 3.8  CL 104 102  CO2 29 26  BUN 10 8  CREATININE 0.84 0.69  GLUCOSE 92 104*  CALCIUM 8.6* 8.8*   No results for input(s): LABPT, INR in the last 72 hours. Right lower extremity exam: Dressing is overall clean and dry.  There is a small amount of drainage on the Ace wrap posteriorly.  Improving knee range of motion.  No redness or streaking up above the dressing.  Dressing is intact.    Assessment/Plan: 2 Days Post-Op Procedure(s) (LRB): DEEP ORTHOPEDIC HARDWARE REMOVAL, RIGHT BELOW THE KNEE AMPUTATION (Right) Plan: Up with therapy Continue PT/OT.  Leave dressing in place.  I am not concerned about the mild drainage on the Ace wrap. Mobilized patient as much as possible.  I talked to him about the importance of knee range of motion.   Matthew Folks 09/07/2021, 9:39 AM

## 2021-09-07 NOTE — Progress Notes (Signed)
Patient is requesting to speak with Dr. Susa Simmonds in the morning.  Patient would not give reason why but patient stated he can wait until morning even though he considers it urgent.  Patient may be unhappy with pain medication regimen.  He has been getting Dilaudid 2 mg iv every 3 hours and Oxy IR 10 mg PO every 3 hours and he still complains of pain being very high and burning.  He is using ice packs tonight.

## 2021-09-07 NOTE — Plan of Care (Signed)

## 2021-09-08 MED ORDER — PREGABALIN 75 MG PO CAPS
75.0000 mg | ORAL_CAPSULE | Freq: Two times a day (BID) | ORAL | Status: DC
  Administered 2021-09-08 – 2021-09-09 (×3): 75 mg via ORAL
  Filled 2021-09-08 (×3): qty 1

## 2021-09-08 NOTE — Progress Notes (Signed)
Subjective: 3 Days Post-Op Procedure(s) (LRB): DEEP ORTHOPEDIC HARDWARE REMOVAL, RIGHT BELOW THE KNEE AMPUTATION (Right) Patient reports pain as moderate.  The patient reports burning pain in his right stump.  Taking by mouth and voiding okay.  Wants to go home tomorrow.  He is still requiring IV pain meds currently, but understands that when he goes home he will be on oral meds.  He is asking about home health needs such as walker/wheelchair he reports that when he was a patient at [facility] he did better with burning pain using Lyrica as opposed to gabapentin.  Objective: Vital signs in last 24 hours: Temp:  [98.5 F (36.9 C)-98.7 F (37.1 C)] 98.7 F (37.1 C) (10/16 0748) Pulse Rate:  [60-70] 70 (10/16 0748) Resp:  [16-18] 17 (10/16 0748) BP: (119-135)/(70-74) 135/74 (10/16 0748) SpO2:  [94 %-96 %] 95 % (10/16 0748)  Intake/Output from previous day: 10/15 0701 - 10/16 0700 In: 840 [P.O.:840] Out: 1300 [Urine:1300] Intake/Output this shift: No intake/output data recorded.  Recent Labs    09/05/21 1837 09/06/21 0237 09/07/21 0151  HGB 13.4 12.6* 13.0   Recent Labs    09/06/21 0237 09/07/21 0151  WBC 13.6* 12.4*  RBC 4.18* 4.34  HCT 37.2* 38.3*  PLT 202 191   Recent Labs    09/06/21 0237 09/07/21 0151  NA 138 136  K 3.8 3.8  CL 104 102  CO2 29 26  BUN 10 8  CREATININE 0.84 0.69  GLUCOSE 92 104*  CALCIUM 8.6* 8.8*   No results for input(s): LABPT, INR in the last 72 hours. Right lower extremity exam: His right stump continues to be wrapped.  The Ace bandage overall is clean and dry.  He is moving his knee.  No redness or streaking into the thigh.   Assessment/Plan: 3 Days Post-Op Procedure(s) (LRB): DEEP ORTHOPEDIC HARDWARE REMOVAL, RIGHT BELOW THE KNEE AMPUTATION (Right) Plan: Will discontinue gabapentin.  We will start Lyrica 75 mg twice daily for his dysesthetic pain. Continue OT. Plan discharge home tomorrow.  Will need home health needs addressed such  as wheelchair/or walker.   Matthew Folks 09/08/2021, 1:41 PM

## 2021-09-08 NOTE — Plan of Care (Signed)
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
## 2021-09-09 LAB — SURGICAL PATHOLOGY

## 2021-09-09 MED ORDER — NAPROXEN SODIUM 550 MG PO TABS: 550.0000 mg | ORAL_TABLET | Freq: Two times a day (BID) | ORAL | 0 refills | Status: AC

## 2021-09-09 MED ORDER — OXYCODONE HCL 5 MG PO TABS: 5.0000 mg | ORAL_TABLET | ORAL | 0 refills | Status: AC | PRN

## 2021-09-09 NOTE — Progress Notes (Signed)
AVS d/c teaching was provided to pt at bedside. Pt verbalized understanding of teaching as it was presented. Pt's IV was removed and it was intact upon observation. Pt has received his wheelchair from DME and was escorted from the unit by staff after collecting his phone and clothing.

## 2021-09-09 NOTE — TOC Transition Note (Signed)
Transition of Care [facility]) - CM/SW Discharge Note   Patient Details  Name: Delphin Funes MRN: [id number] Date of Birth: [birth year]  Transition of Care [facility]) CM/SW Contact:  Epifanio Lesches, RN Phone Number: 09/09/2021, 10:36 AM   Clinical Narrative:     - S/P R leg deep orthopedic hardware removal and R  below the knee amputation, 10/13  Patient will DC to: home  Anticipated DC date: 09/09/2021 Family notified: yes Transport by: car  Presented with chronic RLE pain. From home with mom/dad.       - s/p R BKA 10/13 Per MD patient ready for DC today. RN, patient, and  patient's mom, aware of DC. Pt without therapy needs per PT. DME: W/C noted. Referral made with [employer]. W/C will be delivered to bedside prior to d/c. Pt with post hospital f/u noted on AVS.  Pt without Rx med concerns.  Pt' s mom to provide transportation to home.  RNCM will sign off for now as intervention is no longer needed. Please consult [place] again if new needs arise.    Final next level of care: Home/Self Care     Patient Goals and CMS Choice     Choice offered to / list presented to : Patient  Discharge Placement                       Discharge Plan and Services                DME Arranged: Lightweight manual wheelchair with seat cushion DME Agency: [employer] Date DME Agency Contacted: 09/09/21 Time DME Agency Contacted: 1012 Representative spoke with at DME Agency: Velna Hatchet            Social Determinants of Health (SDOH) Interventions     Readmission Risk Interventions No flowsheet data found.

## 2021-09-09 NOTE — Plan of Care (Signed)
  Problem: Education: Goal: Knowledge of General Education information will improve Description: Including pain rating scale, medication(s)/side effects and non-pharmacologic comfort measures 09/09/2021 1316 by Marsh Dolly, RN Outcome: Adequate for Discharge 09/09/2021 0956 by Marsh Dolly, RN Outcome: Progressing   Problem: Health Behavior/Discharge Planning: Goal: Ability to manage health-related needs will improve 09/09/2021 1316 by Marsh Dolly, RN Outcome: Adequate for Discharge 09/09/2021 0956 by Marsh Dolly, RN Outcome: Progressing   Problem: Clinical Measurements: Goal: Ability to maintain clinical measurements within normal limits will improve 09/09/2021 1316 by Marsh Dolly, RN Outcome: Adequate for Discharge 09/09/2021 0956 by Marsh Dolly, RN Outcome: Progressing Goal: Will remain free from infection 09/09/2021 1316 by Marsh Dolly, RN Outcome: Adequate for Discharge 09/09/2021 0956 by Marsh Dolly, RN Outcome: Progressing Goal: Diagnostic test results will improve 09/09/2021 1316 by Marsh Dolly, RN Outcome: Adequate for Discharge 09/09/2021 0956 by Marsh Dolly, RN Outcome: Progressing Goal: Respiratory complications will improve 09/09/2021 1316 by Marsh Dolly, RN Outcome: Adequate for Discharge 09/09/2021 0956 by Marsh Dolly, RN Outcome: Progressing Goal: Cardiovascular complication will be avoided 09/09/2021 1316 by Marsh Dolly, RN Outcome: Adequate for Discharge 09/09/2021 0956 by Marsh Dolly, RN Outcome: Progressing   Problem: Activity: Goal: Risk for activity intolerance will decrease 09/09/2021 1316 by Marsh Dolly, RN Outcome: Adequate for Discharge 09/09/2021 0956 by Marsh Dolly, RN Outcome: Progressing   Problem: Nutrition: Goal: Adequate nutrition will be maintained 09/09/2021 1316 by Marsh Dolly, RN Outcome: Adequate for Discharge 09/09/2021 0956 by Marsh Dolly, RN Outcome:  Progressing   Problem: Coping: Goal: Level of anxiety will decrease 09/09/2021 1316 by Marsh Dolly, RN Outcome: Adequate for Discharge 09/09/2021 0956 by Marsh Dolly, RN Outcome: Progressing   Problem: Elimination: Goal: Will not experience complications related to bowel motility 09/09/2021 1316 by Marsh Dolly, RN Outcome: Adequate for Discharge 09/09/2021 0956 by Marsh Dolly, RN Outcome: Progressing Goal: Will not experience complications related to urinary retention 09/09/2021 1316 by Marsh Dolly, RN Outcome: Adequate for Discharge 09/09/2021 0956 by Marsh Dolly, RN Outcome: Progressing   Problem: Pain Managment: Goal: General experience of comfort will improve 09/09/2021 1316 by Marsh Dolly, RN Outcome: Adequate for Discharge 09/09/2021 0956 by Marsh Dolly, RN Outcome: Progressing   Problem: Safety: Goal: Ability to remain free from injury will improve 09/09/2021 1316 by Marsh Dolly, RN Outcome: Adequate for Discharge 09/09/2021 0956 by Marsh Dolly, RN Outcome: Progressing   Problem: Skin Integrity: Goal: Risk for impaired skin integrity will decrease 09/09/2021 1316 by Marsh Dolly, RN Outcome: Adequate for Discharge 09/09/2021 0956 by Marsh Dolly, RN Outcome: Progressing

## 2021-09-09 NOTE — Plan of Care (Signed)
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
## 2021-09-10 NOTE — Anesthesia Postprocedure Evaluation (Signed)
Anesthesia Post Note  Patient: Warren Hess  Procedure(s) Performed: DEEP ORTHOPEDIC HARDWARE REMOVAL, RIGHT BELOW THE KNEE AMPUTATION (Right: Knee)     Patient location during evaluation: PACU Anesthesia Type: Regional and General Level of consciousness: awake and alert Pain management: pain level controlled Vital Signs Assessment: post-procedure vital signs reviewed and stable Respiratory status: spontaneous breathing, nonlabored ventilation, respiratory function stable and patient connected to nasal cannula oxygen Cardiovascular status: blood pressure returned to baseline and stable Postop Assessment: no apparent nausea or vomiting Anesthetic complications: no   No notable events documented.  Last Vitals:  Vitals:   09/08/21 2022 09/09/21 0754  BP: (!) 104/59 113/69  Pulse: 71 (!) 57  Resp: 19 18  Temp: 36.6 C 36.6 C  SpO2: 95% 96%    Last Pain:  Vitals:   09/09/21 1226  TempSrc:   PainSc: 5                  Mccoy Testa

## 2021-09-14 ENCOUNTER — Other Ambulatory Visit: Payer: Self-pay

## 2021-09-14 ENCOUNTER — Encounter (HOSPITAL_BASED_OUTPATIENT_CLINIC_OR_DEPARTMENT_OTHER): Payer: Self-pay

## 2021-09-14 ENCOUNTER — Emergency Department (HOSPITAL_BASED_OUTPATIENT_CLINIC_OR_DEPARTMENT_OTHER)
Admission: EM | Admit: 2021-09-14 | Discharge: 2021-09-14 | Disposition: A | Discharge status: Home / Self Care | Payer: Medicare Other | Attending: Emergency Medicine | Admitting: Emergency Medicine

## 2021-09-14 DIAGNOSIS — Z4800 Encounter for change or removal of nonsurgical wound dressing: Secondary | ICD-10-CM | POA: Insufficient documentation

## 2021-09-14 DIAGNOSIS — Z5189 Encounter for other specified aftercare: Secondary | ICD-10-CM

## 2021-09-14 DIAGNOSIS — F17210 Nicotine dependence, cigarettes, uncomplicated: Secondary | ICD-10-CM | POA: Insufficient documentation

## 2021-09-14 NOTE — Discharge Instructions (Signed)
Please call your orthopedic surgeon on Monday to schedule a follow-up appointment for wound check.  Avoid getting the dressed area wet.
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
## 2021-09-14 NOTE — ED Triage Notes (Signed)
Pt arrives in wheelchair from home, pt had RBKA one week ago, attempted to shower and got the dressing wet. Pt has ace bandage over stump in triage. Pt denies any new or different complaints other than dressing change.

## 2021-10-21 ENCOUNTER — Other Ambulatory Visit: Payer: Self-pay | Admitting: Orthopaedic Surgery

## 2021-10-21 ENCOUNTER — Encounter (HOSPITAL_COMMUNITY): Payer: Self-pay | Admitting: Orthopaedic Surgery

## 2021-10-21 NOTE — Anesthesia Preprocedure Evaluation (Addendum)
Anesthesia Evaluation  Patient identified by MRN, date of birth, ID band Patient awake    Reviewed: Allergy & Precautions, NPO status , Patient's Chart, lab work & pertinent test results  History of Anesthesia Complications Negative for: history of anesthetic complications  Airway Mallampati: II  TM Distance: >3 FB Neck ROM: Full    Dental  (+) Missing, Dental Advisory Given,    Pulmonary neg shortness of breath, neg sleep apnea, neg recent URI, Current Smoker and Patient abstained from smoking.,    Pulmonary exam normal        Cardiovascular negative cardio ROS Normal cardiovascular exam     Neuro/Psych PSYCHIATRIC DISORDERS Depression negative neurological ROS     GI/Hepatic negative GI ROS, Neg liver ROS,   Endo/Other    Renal/GU negative Renal ROS     Musculoskeletal RETAINED ORTHOPEDIC HARDWARE, CHRONIC RIGHT LOWER EXTREMITY PAIN   Abdominal   Peds  Hematology negative hematology ROS (+)   Anesthesia Other Findings H/o polytrauma s/p trach with decannulation, site well healed, no evidence of tracheal stenosis  Reproductive/Obstetrics                            Anesthesia Physical  Anesthesia Plan  ASA: 1  Anesthesia Plan: General   Post-op Pain Management:  Regional for Post-op pain   Induction: Intravenous  PONV Risk Score and Plan: 1 and Ondansetron and Dexamethasone  Airway Management Planned: LMA  Additional Equipment: None  Intra-op Plan:   Post-operative Plan: Extubation in OR  Informed Consent: I have reviewed the patients History and Physical, chart, labs and discussed the procedure including the risks, benefits and alternatives for the proposed anesthesia with the patient or authorized representative who has indicated his/her understanding and acceptance.     Dental advisory given  Plan Discussed with: Anesthesiologist and CRNA  Anesthesia Plan Comments:         Anesthesia Quick Evaluation

## 2021-10-21 NOTE — Progress Notes (Signed)
DUE TO COVID-19 ONLY ONE VISITOR IS ALLOWED TO COME WITH YOU AND STAY IN THE WAITING ROOM ONLY DURING PRE OP AND PROCEDURE DAY OF SURGERY.   Two VISITORS MAY VISIT WITH YOU AFTER SURGERY IN YOUR PRIVATE ROOM DURING VISITING HOURS ONLY!  PCP - Dr Merri Brunette Cardiologist - n/a Infectious Disease - Toni Amend Russ-Friedman, NP  Chest x-ray - n/a EKG - n/a Stress Test - n/a ECHO - n/a Cardiac Cath - n/a  ICD Pacemaker/Loop - n/a  Sleep Study -  n/a CPAP - none  ERAS: Clear liquids til 0430 DOS.  STOP now taking any Aspirin (unless otherwise instructed by your surgeon), Aleve, Naproxen, Ibuprofen, Motrin, Advil, Goody's, BC's, all herbal medications, fish oil, and all vitamins.   Coronavirus Screening Covid test is scheduled on DOS Do you have any of the following symptoms:  Cough yes/no: No Fever (>100.42F)  yes/no: No Runny nose yes/no: No Sore throat yes/no: No Difficulty breathing/shortness of breath  yes/no: No  Have you traveled in the last 14 days and where? yes/no: No  Patient verbalized understanding of instructions that were given via phoe.

## 2021-10-22 ENCOUNTER — Other Ambulatory Visit: Payer: Self-pay

## 2021-10-22 ENCOUNTER — Encounter (HOSPITAL_COMMUNITY)
Admission: RE | Disposition: A | Discharge status: Home / Self Care | Payer: Self-pay | Source: Home / Self Care | Attending: Orthopaedic Surgery

## 2021-10-22 ENCOUNTER — Inpatient Hospital Stay (HOSPITAL_COMMUNITY): Payer: Medicare Other | Admitting: Anesthesiology

## 2021-10-22 ENCOUNTER — Encounter (HOSPITAL_COMMUNITY): Payer: Self-pay | Admitting: Orthopaedic Surgery

## 2021-10-22 ENCOUNTER — Ambulatory Visit (HOSPITAL_COMMUNITY)
Admission: RE | Admit: 2021-10-22 | Discharge: 2021-10-22 | Disposition: A | Discharge status: Home / Self Care | Payer: Medicare Other | Attending: Orthopaedic Surgery | Admitting: Orthopaedic Surgery

## 2021-10-22 DIAGNOSIS — F17210 Nicotine dependence, cigarettes, uncomplicated: Secondary | ICD-10-CM | POA: Diagnosis not present

## 2021-10-22 DIAGNOSIS — Z20822 Contact with and (suspected) exposure to covid-19: Secondary | ICD-10-CM | POA: Diagnosis not present

## 2021-10-22 DIAGNOSIS — Z89511 Acquired absence of right leg below knee: Secondary | ICD-10-CM | POA: Diagnosis not present

## 2021-10-22 DIAGNOSIS — Y835 Amputation of limb(s) as the cause of abnormal reaction of the patient, or of later complication, without mention of misadventure at the time of the procedure: Secondary | ICD-10-CM | POA: Insufficient documentation

## 2021-10-22 DIAGNOSIS — T8781 Dehiscence of amputation stump: Secondary | ICD-10-CM | POA: Diagnosis not present

## 2021-10-22 HISTORY — PX: STUMP REVISION: SHX6102

## 2021-10-22 LAB — SARS CORONAVIRUS 2 BY RT PCR (HOSPITAL ORDER, PERFORMED IN ~~LOC~~ HOSPITAL LAB): SARS Coronavirus 2: NEGATIVE

## 2021-10-22 SURGERY — REVISION, AMPUTATION SITE
Anesthesia: General | Site: Leg Lower | Laterality: Right

## 2021-10-22 MED ORDER — CELECOXIB 200 MG PO CAPS
ORAL_CAPSULE | ORAL | Status: AC
  Filled 2021-10-22: qty 1

## 2021-10-22 MED ORDER — FENTANYL CITRATE (PF) 100 MCG/2ML IJ SOLN
INTRAMUSCULAR | Status: AC
  Filled 2021-10-22: qty 2

## 2021-10-22 MED ORDER — PROPOFOL 10 MG/ML IV BOLUS
INTRAVENOUS | Status: DC | PRN
  Administered 2021-10-22: 100 mg via INTRAVENOUS
  Administered 2021-10-22: 200 mg via INTRAVENOUS
  Administered 2021-10-22: 50 mg via INTRAVENOUS

## 2021-10-22 MED ORDER — DEXAMETHASONE SODIUM PHOSPHATE 10 MG/ML IJ SOLN
INTRAMUSCULAR | Status: AC
  Filled 2021-10-22: qty 1

## 2021-10-22 MED ORDER — TRANEXAMIC ACID-NACL 1000-0.7 MG/100ML-% IV SOLN
INTRAVENOUS | Status: AC
  Filled 2021-10-22: qty 100

## 2021-10-22 MED ORDER — CHLORHEXIDINE GLUCONATE 0.12 % MT SOLN
OROMUCOSAL | Status: AC
  Filled 2021-10-22: qty 15

## 2021-10-22 MED ORDER — CEFAZOLIN IN SODIUM CHLORIDE 3-0.9 GM/100ML-% IV SOLN: 3.0000 g | INTRAVENOUS | Status: DC

## 2021-10-22 MED ORDER — FENTANYL CITRATE (PF) 100 MCG/2ML IJ SOLN
25.0000 ug | INTRAMUSCULAR | Status: DC | PRN
  Administered 2021-10-22 (×2): 50 ug via INTRAVENOUS

## 2021-10-22 MED ORDER — DEXMEDETOMIDINE (PRECEDEX) IN NS 20 MCG/5ML (4 MCG/ML) IV SYRINGE
PREFILLED_SYRINGE | INTRAVENOUS | Status: AC
  Filled 2021-10-22: qty 10

## 2021-10-22 MED ORDER — CEFAZOLIN SODIUM-DEXTROSE 2-4 GM/100ML-% IV SOLN
2.0000 g | INTRAVENOUS | Status: AC
  Administered 2021-10-22: 2 g via INTRAVENOUS
  Filled 2021-10-22: qty 100

## 2021-10-22 MED ORDER — PROMETHAZINE HCL 25 MG/ML IJ SOLN: 6.2500 mg | INTRAMUSCULAR | Status: DC | PRN

## 2021-10-22 MED ORDER — CEFAZOLIN SODIUM-DEXTROSE 2-4 GM/100ML-% IV SOLN
INTRAVENOUS | Status: AC
  Filled 2021-10-22: qty 100

## 2021-10-22 MED ORDER — FENTANYL CITRATE (PF) 250 MCG/5ML IJ SOLN
INTRAMUSCULAR | Status: AC
  Filled 2021-10-22: qty 5

## 2021-10-22 MED ORDER — DEXAMETHASONE SODIUM PHOSPHATE 10 MG/ML IJ SOLN
INTRAMUSCULAR | Status: DC | PRN
  Administered 2021-10-22: 5 mg via INTRAVENOUS

## 2021-10-22 MED ORDER — DEXMEDETOMIDINE (PRECEDEX) IN NS 20 MCG/5ML (4 MCG/ML) IV SYRINGE
PREFILLED_SYRINGE | INTRAVENOUS | Status: DC | PRN
  Administered 2021-10-22 (×2): 8 ug via INTRAVENOUS
  Administered 2021-10-22: 4 ug via INTRAVENOUS

## 2021-10-22 MED ORDER — LACTATED RINGERS IV SOLN: INTRAVENOUS | Status: DC

## 2021-10-22 MED ORDER — HYDROMORPHONE HCL 1 MG/ML IJ SOLN
0.2500 mg | INTRAMUSCULAR | Status: DC | PRN
  Administered 2021-10-22 (×2): 0.5 mg via INTRAVENOUS

## 2021-10-22 MED ORDER — OXYCODONE HCL 5 MG PO TABS
5.0000 mg | ORAL_TABLET | ORAL | Status: DC | PRN
  Administered 2021-10-22: 5 mg via ORAL

## 2021-10-22 MED ORDER — LIDOCAINE 2% (20 MG/ML) 5 ML SYRINGE
INTRAMUSCULAR | Status: AC
  Filled 2021-10-22: qty 5

## 2021-10-22 MED ORDER — OXYCODONE HCL 5 MG PO TABS
ORAL_TABLET | ORAL | Status: AC
  Filled 2021-10-22: qty 1

## 2021-10-22 MED ORDER — ACETAMINOPHEN 500 MG PO TABS
ORAL_TABLET | ORAL | Status: AC
  Filled 2021-10-22: qty 2

## 2021-10-22 MED ORDER — AMISULPRIDE (ANTIEMETIC) 5 MG/2ML IV SOLN: 10.0000 mg | Freq: Once | INTRAVENOUS | Status: DC | PRN

## 2021-10-22 MED ORDER — LIDOCAINE 2% (20 MG/ML) 5 ML SYRINGE
INTRAMUSCULAR | Status: DC | PRN
  Administered 2021-10-22: 40 mg via INTRAVENOUS

## 2021-10-22 MED ORDER — ONDANSETRON HCL 4 MG/2ML IJ SOLN
INTRAMUSCULAR | Status: AC
  Filled 2021-10-22: qty 2

## 2021-10-22 MED ORDER — MIDAZOLAM HCL 2 MG/2ML IJ SOLN
INTRAMUSCULAR | Status: AC
  Filled 2021-10-22: qty 2

## 2021-10-22 MED ORDER — PROPOFOL 10 MG/ML IV BOLUS
INTRAVENOUS | Status: AC
  Filled 2021-10-22: qty 20

## 2021-10-22 MED ORDER — SODIUM CHLORIDE 0.9 % IR SOLN
Status: DC | PRN
  Administered 2021-10-22: 3000 mL

## 2021-10-22 MED ORDER — CHLORHEXIDINE GLUCONATE 0.12 % MT SOLN
15.0000 mL | OROMUCOSAL | Status: AC
  Administered 2021-10-22: 15 mL via OROMUCOSAL

## 2021-10-22 MED ORDER — CELECOXIB 200 MG PO CAPS
200.0000 mg | ORAL_CAPSULE | Freq: Once | ORAL | Status: AC
  Administered 2021-10-22: 200 mg via ORAL

## 2021-10-22 MED ORDER — FENTANYL CITRATE (PF) 250 MCG/5ML IJ SOLN
INTRAMUSCULAR | Status: DC | PRN
  Administered 2021-10-22 (×2): 50 ug via INTRAVENOUS
  Administered 2021-10-22: 100 ug via INTRAVENOUS
  Administered 2021-10-22: 50 ug via INTRAVENOUS

## 2021-10-22 MED ORDER — ACETAMINOPHEN 500 MG PO TABS
1000.0000 mg | ORAL_TABLET | Freq: Once | ORAL | Status: AC
  Administered 2021-10-22: 1000 mg via ORAL

## 2021-10-22 MED ORDER — MIDAZOLAM HCL 5 MG/5ML IJ SOLN
INTRAMUSCULAR | Status: DC | PRN
  Administered 2021-10-22: 2 mg via INTRAVENOUS

## 2021-10-22 MED ORDER — HYDROMORPHONE HCL 1 MG/ML IJ SOLN
INTRAMUSCULAR | Status: AC
  Filled 2021-10-22: qty 2

## 2021-10-22 MED ORDER — ONDANSETRON HCL 4 MG/2ML IJ SOLN
INTRAMUSCULAR | Status: DC | PRN
  Administered 2021-10-22: 4 mg via INTRAVENOUS

## 2021-10-22 SURGICAL SUPPLY — 49 items
BAG COUNTER SPONGE SURGICOUNT (BAG) IMPLANT
BAG SURGICOUNT SPONGE COUNTING (BAG)
BANDAGE ESMARK 6X9 LF (GAUZE/BANDAGES/DRESSINGS) IMPLANT
BNDG ELASTIC 4X5.8 VLCR STR LF (GAUZE/BANDAGES/DRESSINGS) ×3 IMPLANT
BNDG ELASTIC 6X10 VLCR STRL LF (GAUZE/BANDAGES/DRESSINGS) IMPLANT
BNDG ESMARK 4X9 LF (GAUZE/BANDAGES/DRESSINGS) IMPLANT
BNDG ESMARK 6X9 LF (GAUZE/BANDAGES/DRESSINGS)
BNDG GAUZE ELAST 4 BULKY (GAUZE/BANDAGES/DRESSINGS) IMPLANT
COVER SURGICAL LIGHT HANDLE (MISCELLANEOUS) ×3 IMPLANT
CUFF TOURN SGL QUICK 34 (TOURNIQUET CUFF) ×2
CUFF TRNQT CYL 34X4.125X (TOURNIQUET CUFF) ×1 IMPLANT
DRAPE U-SHAPE 47X51 STRL (DRAPES) ×3 IMPLANT
DRESSING PEEL AND PLAC PRVNA20 (GAUZE/BANDAGES/DRESSINGS) ×1 IMPLANT
DRSG PAD ABDOMINAL 8X10 ST (GAUZE/BANDAGES/DRESSINGS) IMPLANT
DRSG PEEL AND PLACE PREVENA 20 (GAUZE/BANDAGES/DRESSINGS) ×3
DRSG XEROFORM 1X8 (GAUZE/BANDAGES/DRESSINGS) IMPLANT
DURAPREP 26ML APPLICATOR (WOUND CARE) IMPLANT
ELECT REM PT RETURN 9FT ADLT (ELECTROSURGICAL) ×3
ELECTRODE REM PT RTRN 9FT ADLT (ELECTROSURGICAL) ×1 IMPLANT
GAUZE SPONGE 4X4 12PLY STRL (GAUZE/BANDAGES/DRESSINGS) IMPLANT
GAUZE SPONGE 4X4 12PLY STRL LF (GAUZE/BANDAGES/DRESSINGS) IMPLANT
GAUZE XEROFORM 1X8 LF (GAUZE/BANDAGES/DRESSINGS) IMPLANT
GLOVE SURG ENC TEXT LTX SZ7.5 (GLOVE) ×3 IMPLANT
GLOVE SURG UNDER LTX SZ8 (GLOVE) ×3 IMPLANT
GOWN STRL REUS W/ TWL LRG LVL3 (GOWN DISPOSABLE) ×1 IMPLANT
GOWN STRL REUS W/ TWL XL LVL3 (GOWN DISPOSABLE) ×2 IMPLANT
GOWN STRL REUS W/TWL LRG LVL3 (GOWN DISPOSABLE) ×2
GOWN STRL REUS W/TWL XL LVL3 (GOWN DISPOSABLE) ×4
HANDPIECE INTERPULSE COAX TIP (DISPOSABLE)
KIT BASIN OR (CUSTOM PROCEDURE TRAY) ×3 IMPLANT
KIT DRSG PREVENA PLUS 7DAY 125 (MISCELLANEOUS) ×3 IMPLANT
KIT TURNOVER KIT B (KITS) ×3 IMPLANT
MANIFOLD NEPTUNE II (INSTRUMENTS) ×3 IMPLANT
NEEDLE 22X1 1/2 (OR ONLY) (NEEDLE) IMPLANT
NS IRRIG 1000ML POUR BTL (IV SOLUTION) ×3 IMPLANT
PACK ORTHO EXTREMITY (CUSTOM PROCEDURE TRAY) ×3 IMPLANT
PAD ARMBOARD 7.5X6 YLW CONV (MISCELLANEOUS) ×6 IMPLANT
PAD CAST 4YDX4 CTTN HI CHSV (CAST SUPPLIES) ×1 IMPLANT
PADDING CAST COTTON 4X4 STRL (CAST SUPPLIES) ×2
SET CYSTO W/LG BORE CLAMP LF (SET/KITS/TRAYS/PACK) ×3 IMPLANT
SET HNDPC FAN SPRY TIP SCT (DISPOSABLE) IMPLANT
SPONGE T-LAP 18X18 ~~LOC~~+RFID (SPONGE) ×3 IMPLANT
STOCKINETTE IMPERVIOUS 9X36 MD (GAUZE/BANDAGES/DRESSINGS) ×3 IMPLANT
SUT ETHILON 2 0 FS 18 (SUTURE) IMPLANT
SUT ETHILON 2 0 PSLX (SUTURE) ×3 IMPLANT
TUBE CONNECTING 12'X1/4 (SUCTIONS) ×1
TUBE CONNECTING 12X1/4 (SUCTIONS) ×2 IMPLANT
UNDERPAD 30X36 HEAVY ABSORB (UNDERPADS AND DIAPERS) ×3 IMPLANT
YANKAUER SUCT BULB TIP NO VENT (SUCTIONS) ×3 IMPLANT

## 2021-10-22 NOTE — Anesthesia Postprocedure Evaluation (Signed)
Anesthesia Post Note  Patient: Warren Hess  Procedure(s) Performed: RIGHT LEG AMPUTATION SCAR REVISION (Right: Leg Lower)     Patient location during evaluation: PACU Anesthesia Type: General Level of consciousness: sedated Pain management: pain level controlled Vital Signs Assessment: post-procedure vital signs reviewed and stable Respiratory status: spontaneous breathing and respiratory function stable Cardiovascular status: stable Postop Assessment: no apparent nausea or vomiting Anesthetic complications: no   No notable events documented.  Last Vitals:  Vitals:   10/22/21 0850 10/22/21 0905  BP: 110/71 103/69  Pulse: 63 (!) 59  Resp: 14 16  Temp:  36.5 C  SpO2: 94% 97%    Last Pain:  Vitals:   10/22/21 0905  TempSrc:   PainSc: 10-Worst pain ever                 Akshar Starnes DANIEL
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
## 2021-10-22 NOTE — Op Note (Signed)
Warren Hess male 30 y.o. 10/22/2021  PreOperative Diagnosis: Right below the knee amputation wound dehiscence  PostOperative Diagnosis: Right below the knee amputation wound dehiscence  PROCEDURE: Irrigation and excisional debridement of right below the knee amputation wound total wound size before debridement was 13 cm x 5 cm x 2 cm Scar/wound revision of right below the knee amputation site Application of negative pressure wound VAC  SURGEON: Dub Mikes, MD  ASSISTANT: Jesse [place], PA-C: His assistance was necessary for prep and drape, exposure, holding retractors, debridement of the wound, wound closure and splinting.   ANESTHESIA: General LMA anesthesia  FINDINGS: Wound dehiscence of the medial lateral side of the wound.  Central portion of the wound was closed.  No significant amount of nonviable tissue present.  No sign of infection.  Good bleeding skin edges after debridement.  IMPLANTS: None  INDICATIONS:30 y.o. male is 6 weeks out from below the knee amputation had some wound dehiscence laterally and medially.  The patient is a smoker and after several discussions about smoking cessation he has not ceased smoking.  He had gapping of his wounds medially and laterally of several centimeters and had some scabbing and drainage.  No significant signs of infection or foul odor.  Given the dehiscence he was indicated for irrigation and debridement of the wounds with revision wound closure of his below the knee amputation site.   Patient understood the risks, benefits and alternatives to surgery which include but are not limited to wound healing complications, infection, nonunion, malunion, need for further surgery as well as damage to surrounding structures. They also understood the potential for continued pain in that there were no guarantees of acceptable outcome After weighing these risks the patient opted to proceed with surgery.  PROCEDURE: Patient was identified  in the preoperative holding area.  The right leg was marked by myself.  Consent was signed by myself and the patient.  Block was performed by anesthesia in the preoperative holding area.  Patient was taken to the operative suite and placed supine on the operative table.  General LMA anesthesia was induced without difficulty. Bump was placed under the operative hip and bone foam was used.  All bony prominences were well padded.  Tourniquet was placed on the operative thigh.  Preoperative antibiotics were given. The extremity was prepped and draped in the usual sterile fashion.  We began by inspecting the wound.  There was some remaining sutures and these were removed.  Then the scab tissue was removed.  There was a wound medially and laterally about the ends amputation site.  This was not the extreme lateral medial wounds which are healed.  There was a wound laterally that was approximately 6 cm x 2 cm x 1 cm deep.  There was a wound medially that was 7 cm x 3 cm x 1 cm.  There was scabbing tissue.  We began with wound debridement.  Debridement type: Excisional Debridement  Side: right  Body Location: Below the knee amputation site  Tools used for debridement: scalpel, curette, and rongeur  Pre-debridement Wound size (cm):   Length: 13 cm        width: 5 cm     depth: 2 cm  Post-debridement Wound size (cm):   Length: 14 cm        width: 6 cm     depth: 3 cm  Debridement depth beyond dead/damaged tissue down to healthy viable tissue: yes  Tissue layer involved: skin, subcutaneous tissue, muscle / fascia  Nature of tissue removed: Slough, [employer], and Non-viable tissue  Irrigation volume: 1000 cc     Irrigation fluid type: Normal Saline   After debridement of the tissues there was good bleeding skin edges all about.  There was no devitalized tissue that remained.  We then proceeded with revision closure.  The amputation wounds were fully debrided and the skin edges were removed  back to bleeding skin edges.  Then the wound was closed in a complex layered fashion incorporating the deep tissue and the dermal tissue and the skin.  This was done in a tension-free manner using tension sutures, horizontal mattress sutures and simple sutures.  2-0 nylon was used primarily.  During the closure it was noted that there was no exposed bone.  There was good bleeding edges to the skin that tissue and there is eversion of the skin edges.  After complete closure the leg was cleaned.  It was inspected and found to be acceptably closed in a tension-free manner.  Then we placed a negative pressure wound dressing over the incision.  It was attached to a Prevena canister.  Soft dressing was placed over the sponge.  There was good suction after placement of the VAC.  The patient was then awakened from anesthesia and taken recovery in stable condition.  There were no complications.  He tolerated the procedure well.  POST OPERATIVE INSTRUCTIONS: Continue wound VAC. He will follow-up in 3 days for dressing removal and wound check. Leave sutures in place until wound is fully healed unless otherwise indicated. Call the office with concerns  TOURNIQUET TIME: No tourniquet  BLOOD LOSS:  Minimal         DRAINS: none         SPECIMEN: none       COMPLICATIONS:  * No complications entered in OR log *         Disposition: PACU - hemodynamically stable.         Condition: stable

## 2021-10-22 NOTE — Anesthesia Procedure Notes (Signed)
Procedure Name: LMA Insertion Date/Time: 10/22/2021 7:15 AM Performed by: Waynard Edwards, CRNA Pre-anesthesia Checklist: Patient identified, Emergency Drugs available, Suction available and Patient being monitored Patient Re-evaluated:Patient Re-evaluated prior to induction Oxygen Delivery Method: Circle system utilized Preoxygenation: Pre-oxygenation with 100% oxygen Induction Type: IV induction Ventilation: Mask ventilation without difficulty LMA: LMA inserted LMA Size: 4.0 Number of attempts: 1 Placement Confirmation: positive ETCO2 Tube secured with: Tape Dental Injury: Teeth and Oropharynx as per pre-operative assessment

## 2021-10-22 NOTE — Transfer of Care (Signed)
Immediate Anesthesia Transfer of Care Note  Patient: Warren Hess  Procedure(s) Performed: RIGHT LEG AMPUTATION SCAR REVISION (Right: Leg Lower)  Patient Location: PACU  Anesthesia Type:General  Level of Consciousness: awake, alert , oriented and patient cooperative  Airway & Oxygen Therapy: Patient Spontanous Breathing and Patient connected to nasal cannula oxygen  Post-op Assessment: Report given to RN and Post -op Vital signs reviewed and stable  Post vital signs: Reviewed and stable  Last Vitals:  Vitals Value Taken Time  BP 122/78 10/22/21 0820  Temp 36.5 C 10/22/21 0820  Pulse 68 10/22/21 0822  Resp 12 10/22/21 0822  SpO2 96 % 10/22/21 0822  Vitals shown include unvalidated device data.  Last Pain:  Vitals:   10/22/21 0611  TempSrc: Oral         Complications: No notable events documented.

## 2021-10-23 ENCOUNTER — Encounter (HOSPITAL_COMMUNITY): Payer: Self-pay | Admitting: Orthopaedic Surgery

## 2022-01-10 DIAGNOSIS — M84475K Pathological fracture, left foot, subsequent encounter for fracture with nonunion: Secondary | ICD-10-CM | POA: Diagnosis not present

## 2022-01-10 DIAGNOSIS — S88119A Complete traumatic amputation at level between knee and ankle, unspecified lower leg, initial encounter: Secondary | ICD-10-CM | POA: Diagnosis not present

## 2022-01-10 DIAGNOSIS — R531 Weakness: Secondary | ICD-10-CM | POA: Diagnosis not present

## 2022-01-30 DIAGNOSIS — Z89511 Acquired absence of right leg below knee: Secondary | ICD-10-CM | POA: Diagnosis not present

## 2022-02-03 ENCOUNTER — Encounter: Payer: Self-pay | Admitting: Physical Therapy

## 2022-02-03 ENCOUNTER — Other Ambulatory Visit: Payer: Self-pay

## 2022-02-03 ENCOUNTER — Ambulatory Visit: Payer: Medicare Other | Admitting: Physical Therapy

## 2022-02-03 DIAGNOSIS — R2681 Unsteadiness on feet: Secondary | ICD-10-CM | POA: Diagnosis not present

## 2022-02-03 DIAGNOSIS — R2689 Other abnormalities of gait and mobility: Secondary | ICD-10-CM
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
## 2022-02-06 ENCOUNTER — Other Ambulatory Visit: Payer: Self-pay

## 2022-02-06 ENCOUNTER — Ambulatory Visit (INDEPENDENT_AMBULATORY_CARE_PROVIDER_SITE_OTHER): Payer: Medicare Other | Admitting: Physical Therapy

## 2022-02-06 ENCOUNTER — Encounter: Payer: Self-pay | Admitting: Physical Therapy

## 2022-02-06 DIAGNOSIS — R2689 Other abnormalities of gait and mobility: Secondary | ICD-10-CM | POA: Diagnosis not present

## 2022-02-06 DIAGNOSIS — R2681 Unsteadiness on feet: Secondary | ICD-10-CM | POA: Diagnosis not present

## 2022-02-06 NOTE — Progress Notes (Signed)
?   02/06/22 0909  ?PT Education  ?Education Details HEP sink & Access Code: [email or web address]  ?Person(s) Educated Patient;Parent(s)  ?Methods Explanation;Demonstration;Tactile cues;Verbal cues;Handout  ?Comprehension Verbalized understanding;Returned demonstration;Need further instruction  ? ? ?

## 2022-02-06 NOTE — Patient Instructions (Signed)
Do each exercise 1-2  times per day ?Do each exercise 5-10 repetitions ?Hold each exercise for 2 seconds to feel your location ? ?AT [place] AND PLACE FEET EQUAL DISTANCE FROM THE MIDLINE.  Try to find this position when standing still for activities.  ? ?USE TAPE ON FLOOR TO MARK THE MIDLINE POSITION which is even with middle of sink.  ?You also should try to feel with your limb pressure in socket.  You are trying to feel with limb what you used to feel with the bottom of your foot. ? ?Side to Side Shift: Moving your hips only (not shoulders): move weight onto your left leg, HOLD/FEEL pressure in socket.  Move back to equal weight on each leg, HOLD/FEEL pressure in socket. Move weight onto your right leg, HOLD/FEEL pressure in socket. Move back to equal weight on each leg, HOLD/FEEL pressure in socket. Repeat.  Start with both hands on sink, progress to hand on prosthetic side only, then no hands.  ?Front to Back Shift: Moving your hips only (not shoulders): move your weight forward onto your toes, HOLD/FEEL pressure in socket. Move your weight back to equal Flat Foot on both legs, HOLD/FEEL  pressure in socket. Move your weight back onto your heels, HOLD/FEEL  pressure in socket. Move your weight back to equal on both legs, HOLD/FEEL  pressure in socket. Repeat.  Start with both hands on sink, progress to hand on prosthetic side only, then no hands.  ?Moving Cones / Cups: With equal weight on each leg: Hold on with one hand the first time, then progress to no hand supports. Move cups from one side of sink to the other. Place cups ~2? out of your reach, progress to 10? beyond reach.  Place one hand in middle of sink and reach with other hand. Do both arms.  Then hover one hand and move cups with other hand.  ?Overhead/Upward Reaching: alternated reaching up to top cabinets or ceiling if no cabinets present. Keep equal weight on each leg. Start with one hand support on counter while other  hand reaches and progress to no hand support with reaching.  ace one hand in middle of sink and reach with other hand. Do both arms.  Then hover one hand and move cups with other hand.  ?5.   Looking Over Shoulders: With equal weight on each leg: alternate turning to look over your shoulders with one hand support on counter as needed.  Start with head motions only to look in front of shoulder, then even with shoulder and progress to looking behind you. To look to side, move head /eyes, then shoulder on side looking pulls back, shift more weight to side looking and pull hip back. Place one hand in middle of sink and let go with other hand so your shoulder can pull back. Switch hands to look other way.   Then hover one hand and look over shoulder. If looking right, use left hand at sink. If looking left, use right hand at sink. ?6.  Stepping with leg that is not amputated:  Move items under cabinet out of your way. Shift your hips/pelvis so weight on prosthesis. Tighten muscles in hip on prosthetic side.  SLOWLY step other leg so front of foot is in cabinet. Then step back to floor. ? ? ?Hanger Socks: 1-ply is yellow color at top, 3-ply is green at top, 5-ply is navy blue at top ?How many ply you need depends on your  limb size.  You should have even pressure on your limb when standing & walking.  ?Guidance points: 1. How ease it goes on? Should be some resistance. Too few it goes on too easily. Too many it takes a lot of work to get it on. ?2. How many clicks you get. Especially clicks in sitting. ?3. After standing or walking, check knee cap. Bottom should be just under the front lip.  Too few bottom of knee cap sits on indention. Too many bottom is above front lip. ?4. Have your feet beside each other & hips over feet. Place hands on your waist. Pelvis Should be level. Too few prosthetic side will be low. Too many prosthetic side will be high.  ?  ?Get ply socks correct before you leave the house. Take extra socks  with you. Take one 3-ply and two 1-ply with you. This is in addition to what you are wearing.     ?
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
## 2022-02-11 ENCOUNTER — Ambulatory Visit: Payer: Medicare Other | Admitting: Physical Therapy

## 2022-02-11 ENCOUNTER — Encounter: Payer: Self-pay | Admitting: Physical Therapy

## 2022-02-11 ENCOUNTER — Other Ambulatory Visit: Payer: Self-pay

## 2022-02-11 DIAGNOSIS — R2689 Other abnormalities of gait and mobility: Secondary | ICD-10-CM

## 2022-02-11 DIAGNOSIS — R2681 Unsteadiness on feet: Secondary | ICD-10-CM | POA: Diagnosis not present
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
## 2022-02-18 ENCOUNTER — Encounter: Payer: Self-pay | Admitting: Physical Therapy

## 2022-02-18 ENCOUNTER — Other Ambulatory Visit: Payer: Self-pay

## 2022-02-18 ENCOUNTER — Ambulatory Visit (INDEPENDENT_AMBULATORY_CARE_PROVIDER_SITE_OTHER): Payer: Medicare Other | Admitting: Physical Therapy

## 2022-02-18 DIAGNOSIS — R2689 Other abnormalities of gait and mobility: Secondary | ICD-10-CM

## 2022-02-18 DIAGNOSIS — R2681 Unsteadiness on feet: Secondary | ICD-10-CM | POA: Diagnosis not present

## 2022-02-22 DIAGNOSIS — M84475K Pathological fracture, left foot, subsequent encounter for fracture with nonunion: Secondary | ICD-10-CM | POA: Diagnosis not present

## 2022-02-22 DIAGNOSIS — R531 Weakness: Secondary | ICD-10-CM | POA: Diagnosis not present

## 2022-02-22 DIAGNOSIS — S88119A Complete traumatic amputation at level between knee and ankle, unspecified lower leg, initial encounter: Secondary | ICD-10-CM | POA: Diagnosis not present

## 2022-02-24 ENCOUNTER — Other Ambulatory Visit: Payer: Self-pay

## 2022-02-24 ENCOUNTER — Ambulatory Visit: Payer: Medicare Other | Admitting: Physical Therapy

## 2022-02-24 ENCOUNTER — Encounter: Payer: Self-pay | Admitting: Physical Therapy

## 2022-02-24 DIAGNOSIS — R2681 Unsteadiness on feet: Secondary | ICD-10-CM

## 2022-02-24 DIAGNOSIS — R2689 Other abnormalities of gait and mobility: Secondary | ICD-10-CM | POA: Diagnosis not present
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
## 2022-03-03 ENCOUNTER — Encounter: Payer: Medicare Other | Admitting: Physical Therapy
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
## 2022-03-11 ENCOUNTER — Encounter: Payer: Self-pay | Admitting: Physical Therapy

## 2022-03-11 ENCOUNTER — Other Ambulatory Visit: Payer: Self-pay

## 2022-03-11 ENCOUNTER — Ambulatory Visit (INDEPENDENT_AMBULATORY_CARE_PROVIDER_SITE_OTHER): Payer: Medicare Other | Admitting: Physical Therapy

## 2022-03-11 DIAGNOSIS — R2681 Unsteadiness on feet: Secondary | ICD-10-CM | POA: Diagnosis not present

## 2022-03-11 DIAGNOSIS — R2689 Other abnormalities of gait and mobility: Secondary | ICD-10-CM
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
## 2022-03-18 ENCOUNTER — Other Ambulatory Visit: Payer: Self-pay

## 2022-03-18 ENCOUNTER — Ambulatory Visit (INDEPENDENT_AMBULATORY_CARE_PROVIDER_SITE_OTHER): Payer: Medicare Other | Admitting: Physical Therapy

## 2022-03-18 ENCOUNTER — Encounter: Payer: Self-pay | Admitting: Physical Therapy

## 2022-03-18 DIAGNOSIS — R2689 Other abnormalities of gait and mobility: Secondary | ICD-10-CM

## 2022-03-18 DIAGNOSIS — R2681 Unsteadiness on feet: Secondary | ICD-10-CM | POA: Diagnosis not present
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
## 2022-03-25 ENCOUNTER — Encounter: Payer: Medicare Other | Admitting: Physical Therapy

## 2022-04-07 DIAGNOSIS — Z9889 Other specified postprocedural states: Secondary | ICD-10-CM | POA: Diagnosis not present

## 2022-04-07 DIAGNOSIS — M79661 Pain in right lower leg: Secondary | ICD-10-CM | POA: Diagnosis not present

## 2022-04-08 DIAGNOSIS — R5383 Other fatigue: Secondary | ICD-10-CM | POA: Diagnosis not present

## 2022-04-08 DIAGNOSIS — Z0000 Encounter for general adult medical examination without abnormal findings: Secondary | ICD-10-CM | POA: Diagnosis not present

## 2022-04-09 DIAGNOSIS — M84475K Pathological fracture, left foot, subsequent encounter for fracture with nonunion: Secondary | ICD-10-CM | POA: Diagnosis not present

## 2022-04-09 DIAGNOSIS — S88119A Complete traumatic amputation at level between knee and ankle, unspecified lower leg, initial encounter: Secondary | ICD-10-CM | POA: Diagnosis not present

## 2022-04-09 DIAGNOSIS — R531 Weakness: Secondary | ICD-10-CM | POA: Diagnosis not present

## 2022-04-14 DIAGNOSIS — Z9889 Other specified postprocedural states: Secondary | ICD-10-CM | POA: Diagnosis not present

## 2022-04-15 DIAGNOSIS — M79604 Pain in right leg: Secondary | ICD-10-CM | POA: Diagnosis not present

## 2022-04-15 DIAGNOSIS — Z89511 Acquired absence of right leg below knee: Secondary | ICD-10-CM | POA: Diagnosis not present

## 2022-04-15 DIAGNOSIS — Z0000 Encounter for general adult medical examination without abnormal findings: Secondary | ICD-10-CM | POA: Diagnosis not present

## 2022-05-10 DIAGNOSIS — M84475K Pathological fracture, left foot, subsequent encounter for fracture with nonunion: Secondary | ICD-10-CM | POA: Diagnosis not present

## 2022-05-10 DIAGNOSIS — S88119A Complete traumatic amputation at level between knee and ankle, unspecified lower leg, initial encounter: Secondary | ICD-10-CM | POA: Diagnosis not present

## 2022-05-10 DIAGNOSIS — R531 Weakness: Secondary | ICD-10-CM | POA: Diagnosis not present

## 2022-06-09 DIAGNOSIS — S88119A Complete traumatic amputation at level between knee and ankle, unspecified lower leg, initial encounter: Secondary | ICD-10-CM | POA: Diagnosis not present

## 2022-06-09 DIAGNOSIS — M84475K Pathological fracture, left foot, subsequent encounter for fracture with nonunion: Secondary | ICD-10-CM | POA: Diagnosis not present

## 2022-06-09 DIAGNOSIS — R531 Weakness: Secondary | ICD-10-CM | POA: Diagnosis not present

## 2022-06-13 DIAGNOSIS — B079 Viral wart, unspecified: Secondary | ICD-10-CM | POA: Diagnosis not present

## 2022-07-10 DIAGNOSIS — M84475K Pathological fracture, left foot, subsequent encounter for fracture with nonunion: Secondary | ICD-10-CM | POA: Diagnosis not present

## 2022-07-10 DIAGNOSIS — R531 Weakness: Secondary | ICD-10-CM | POA: Diagnosis not present

## 2022-07-10 DIAGNOSIS — S88119A Complete traumatic amputation at level between knee and ankle, unspecified lower leg, initial encounter: Secondary | ICD-10-CM | POA: Diagnosis not present

## 2022-07-22 DIAGNOSIS — Z79899 Other long term (current) drug therapy: Secondary | ICD-10-CM | POA: Diagnosis not present

## 2022-08-05 DIAGNOSIS — F5101 Primary insomnia: Secondary | ICD-10-CM | POA: Diagnosis not present

## 2022-08-10 DIAGNOSIS — S88119A Complete traumatic amputation at level between knee and ankle, unspecified lower leg, initial encounter: Secondary | ICD-10-CM | POA: Diagnosis not present

## 2022-08-10 DIAGNOSIS — M84475K Pathological fracture, left foot, subsequent encounter for fracture with nonunion: Secondary | ICD-10-CM | POA: Diagnosis not present

## 2022-08-10 DIAGNOSIS — R531 Weakness: Secondary | ICD-10-CM | POA: Diagnosis not present

## 2022-09-02 DIAGNOSIS — L03114 Cellulitis of left upper limb: Secondary | ICD-10-CM | POA: Diagnosis not present

## 2022-09-02 DIAGNOSIS — L03116 Cellulitis of left lower limb: Secondary | ICD-10-CM | POA: Diagnosis not present

## 2022-09-02 DIAGNOSIS — L539 Erythematous condition, unspecified: Secondary | ICD-10-CM | POA: Diagnosis not present

## 2022-09-09 DIAGNOSIS — M84475K Pathological fracture, left foot, subsequent encounter for fracture with nonunion: Secondary | ICD-10-CM | POA: Diagnosis not present

## 2022-09-09 DIAGNOSIS — S88119A Complete traumatic amputation at level between knee and ankle, unspecified lower leg, initial encounter: Secondary | ICD-10-CM | POA: Diagnosis not present

## 2022-09-09 DIAGNOSIS — R531 Weakness: Secondary | ICD-10-CM | POA: Diagnosis not present

## 2022-09-29 ENCOUNTER — Ambulatory Visit (INDEPENDENT_AMBULATORY_CARE_PROVIDER_SITE_OTHER): Payer: Medicare Other | Admitting: Psychologist

## 2022-09-29 DIAGNOSIS — F331 Major depressive disorder, recurrent, moderate: Secondary | ICD-10-CM | POA: Diagnosis not present

## 2022-09-29 DIAGNOSIS — Z634 Disappearance and death of family member: Secondary | ICD-10-CM | POA: Diagnosis not present

## 2022-09-29 NOTE — Progress Notes (Signed)
                Joshoa Shawler, PsyD 
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
## 2022-10-10 DIAGNOSIS — M84475K Pathological fracture, left foot, subsequent encounter for fracture with nonunion: Secondary | ICD-10-CM | POA: Diagnosis not present

## 2022-10-10 DIAGNOSIS — S88119A Complete traumatic amputation at level between knee and ankle, unspecified lower leg, initial encounter: Secondary | ICD-10-CM | POA: Diagnosis not present

## 2022-10-10 DIAGNOSIS — R531 Weakness: Secondary | ICD-10-CM | POA: Diagnosis not present

## 2022-11-03 ENCOUNTER — Encounter (HOSPITAL_COMMUNITY): Payer: Self-pay | Admitting: Registered Nurse

## 2022-11-03 ENCOUNTER — Other Ambulatory Visit (HOSPITAL_COMMUNITY)
Admission: EM | Admit: 2022-11-03 | Discharge: 2022-11-06 | Disposition: A | Discharge status: Home / Self Care | Payer: Medicare Other | Attending: Psychiatry | Admitting: Psychiatry

## 2022-11-03 DIAGNOSIS — Z79899 Other long term (current) drug therapy: Secondary | ICD-10-CM | POA: Insufficient documentation

## 2022-11-03 DIAGNOSIS — Z1152 Encounter for screening for COVID-19: Secondary | ICD-10-CM | POA: Insufficient documentation

## 2022-11-03 DIAGNOSIS — F1520 Other stimulant dependence, uncomplicated: Secondary | ICD-10-CM | POA: Diagnosis present

## 2022-11-03 DIAGNOSIS — F22 Delusional disorders: Secondary | ICD-10-CM | POA: Insufficient documentation

## 2022-11-03 DIAGNOSIS — F17210 Nicotine dependence, cigarettes, uncomplicated: Secondary | ICD-10-CM | POA: Insufficient documentation

## 2022-11-03 DIAGNOSIS — Z9151 Personal history of suicidal behavior: Secondary | ICD-10-CM | POA: Diagnosis not present

## 2022-11-03 DIAGNOSIS — F1524 Other stimulant dependence with stimulant-induced mood disorder: Secondary | ICD-10-CM | POA: Diagnosis not present

## 2022-11-03 DIAGNOSIS — F1994 Other psychoactive substance use, unspecified with psychoactive substance-induced mood disorder: Secondary | ICD-10-CM | POA: Diagnosis present

## 2022-11-03 DIAGNOSIS — F331 Major depressive disorder, recurrent, moderate: Secondary | ICD-10-CM | POA: Diagnosis present

## 2022-11-03 DIAGNOSIS — Z89511 Acquired absence of right leg below knee: Secondary | ICD-10-CM | POA: Insufficient documentation

## 2022-11-03 LAB — URINALYSIS, ROUTINE W REFLEX MICROSCOPIC
Bilirubin Urine: NEGATIVE
Glucose, UA: NEGATIVE mg/dL
Hgb urine dipstick: NEGATIVE
Ketones, ur: 5 mg/dL — AB
Nitrite: NEGATIVE
Protein, ur: 100 mg/dL — AB
Specific Gravity, Urine: 1.034 — ABNORMAL HIGH (ref 1.005–1.030)
pH: 5 (ref 5.0–8.0)

## 2022-11-03 LAB — POCT URINE DRUG SCREEN - MANUAL ENTRY (I-SCREEN)
POC Amphetamine UR: POSITIVE — AB
POC Buprenorphine (BUP): NOT DETECTED
POC Cocaine UR: NOT DETECTED
POC Marijuana UR: NOT DETECTED
POC Methadone UR: NOT DETECTED
POC Methamphetamine UR: POSITIVE — AB
POC Morphine: NOT DETECTED
POC Oxazepam (BZO): NOT DETECTED
POC Oxycodone UR: NOT DETECTED
POC Secobarbital (BAR): NOT DETECTED

## 2022-11-03 LAB — RESP PANEL BY RT-PCR (FLU A&B, COVID) ARPGX2
Influenza A by PCR: NEGATIVE
Influenza B by PCR: NEGATIVE
SARS Coronavirus 2 by RT PCR: NEGATIVE

## 2022-11-03 MED ORDER — ALUM & MAG HYDROXIDE-SIMETH 200-200-20 MG/5ML PO SUSP: 30.0000 mL | ORAL | Status: DC | PRN

## 2022-11-03 MED ORDER — NICOTINE 21 MG/24HR TD PT24
21.0000 mg | MEDICATED_PATCH | Freq: Once | TRANSDERMAL | Status: AC
  Administered 2022-11-03: 21 mg via TRANSDERMAL

## 2022-11-03 MED ORDER — IBUPROFEN 600 MG PO TABS: 600.0000 mg | ORAL_TABLET | Freq: Four times a day (QID) | ORAL | Status: DC | PRN

## 2022-11-03 MED ORDER — MAGNESIUM HYDROXIDE 400 MG/5ML PO SUSP
30.0000 mL | Freq: Every day | ORAL | Status: DC | PRN
  Administered 2022-11-04: 30 mL via ORAL
  Filled 2022-11-03: qty 30

## 2022-11-03 MED ORDER — TRAZODONE HCL 50 MG PO TABS
50.0000 mg | ORAL_TABLET | Freq: Every evening | ORAL | Status: DC | PRN
  Administered 2022-11-03 – 2022-11-05 (×2): 50 mg via ORAL
  Filled 2022-11-03 (×2): qty 1

## 2022-11-03 MED ORDER — PREGABALIN 50 MG PO CAPS
50.0000 mg | ORAL_CAPSULE | Freq: Two times a day (BID) | ORAL | Status: DC
  Administered 2022-11-03 – 2022-11-06 (×7): 50 mg via ORAL
  Filled 2022-11-03 (×7): qty 1

## 2022-11-03 MED ORDER — SERTRALINE HCL 25 MG PO TABS
25.0000 mg | ORAL_TABLET | Freq: Every day | ORAL | Status: DC
  Administered 2022-11-03 – 2022-11-06 (×4): 25 mg via ORAL
  Filled 2022-11-03 (×4): qty 1

## 2022-11-03 MED ORDER — ACETAMINOPHEN 325 MG PO TABS: 650.0000 mg | ORAL_TABLET | Freq: Four times a day (QID) | ORAL | Status: DC | PRN

## 2022-11-03 MED ORDER — HYDROXYZINE HCL 25 MG PO TABS
25.0000 mg | ORAL_TABLET | Freq: Three times a day (TID) | ORAL | Status: DC | PRN
  Administered 2022-11-04 – 2022-11-05 (×5): 25 mg via ORAL
  Filled 2022-11-03 (×5): qty 1

## 2022-11-03 MED ORDER — MELATONIN 5 MG PO TABS
10.0000 mg | ORAL_TABLET | Freq: Every day | ORAL | Status: DC
  Administered 2022-11-03 – 2022-11-05 (×3): 10 mg via ORAL
  Filled 2022-11-03 (×3): qty 2

## 2022-11-03 MED ORDER — MIRTAZAPINE 15 MG PO TABS
15.0000 mg | ORAL_TABLET | Freq: Every day | ORAL | Status: DC
  Administered 2022-11-03 – 2022-11-05 (×3): 15 mg via ORAL
  Filled 2022-11-03 (×3): qty 1

## 2022-11-03 NOTE — ED Notes (Addendum)
Pt is alert x 3. Denies SI/HI/AVH. Calm, cooperative throughout interview process. Skin assessment completed. Oriented to unit. Meal and drink offered. At currrent, pt continue to deny SI/HI/AVH. Pt verbally contract for safety. Will monitor for safety. Belonging were place in locker 15.

## 2022-11-03 NOTE — ED Notes (Signed)
Pt resting in bed. A&O x4, calm and cooperative. Denies current SI/HI/AVH. No signs of distress noted. Monitoring for safety. 
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
## 2022-11-03 NOTE — Progress Notes (Addendum)
Warren Hess refused Tylenol and motrin for generalized pain.He ate dinner with his peers.

## 2022-11-03 NOTE — ED Notes (Signed)
Pt sleeping@this time. breathing even and unlabored. Will continue to monitor for safety 

## 2022-11-03 NOTE — ED Triage Notes (Signed)
Pt presents to [facility] voluntarily accompanied by his mother seeking detox and substance use treatment for use of "speed". Pt states he recently relapsed and would like treatment. Pt reports last use of "speed" was around 5am this morning and he used "1 shot". Pt reports being diagnosed with MDD and he is prescribed Sertraline, but he has not taken this medication in about 1 week. Pt is fidgety during trirage. Pt reports auditory hallucinations at this time "hearing sirens". Pt denies SI/HI and visual hallucinations.
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
## 2022-11-03 NOTE — Progress Notes (Signed)
Received Warren Hess from the [facility], he was given the paperwork for the admission process and oriented to his new environment. He endorsed feeling anxious and very restless.

## 2022-11-04 ENCOUNTER — Encounter (HOSPITAL_COMMUNITY): Payer: Self-pay

## 2022-11-04 ENCOUNTER — Encounter (HOSPITAL_COMMUNITY): Payer: Self-pay | Admitting: Registered Nurse

## 2022-11-04 DIAGNOSIS — F1994 Other psychoactive substance use, unspecified with psychoactive substance-induced mood disorder: Secondary | ICD-10-CM | POA: Diagnosis not present

## 2022-11-04 DIAGNOSIS — Z9151 Personal history of suicidal behavior: Secondary | ICD-10-CM | POA: Diagnosis not present

## 2022-11-04 DIAGNOSIS — Z89511 Acquired absence of right leg below knee: Secondary | ICD-10-CM | POA: Diagnosis not present

## 2022-11-04 DIAGNOSIS — Z1152 Encounter for screening for COVID-19: Secondary | ICD-10-CM | POA: Diagnosis not present

## 2022-11-04 DIAGNOSIS — F17210 Nicotine dependence, cigarettes, uncomplicated: Secondary | ICD-10-CM | POA: Diagnosis not present

## 2022-11-04 DIAGNOSIS — Z79899 Other long term (current) drug therapy: Secondary | ICD-10-CM | POA: Diagnosis not present

## 2022-11-04 MED ORDER — NICOTINE POLACRILEX 2 MG MT GUM
4.0000 mg | CHEWING_GUM | OROMUCOSAL | Status: DC | PRN
  Administered 2022-11-04 – 2022-11-05 (×8): 4 mg via ORAL
  Filled 2022-11-04 (×8): qty 2

## 2022-11-04 NOTE — ED Notes (Signed)
Pt sleeping in no acute distress. RR even and unlabored. Environment secured. Will continue to monitor for safety. 

## 2022-11-04 NOTE — ED Notes (Signed)
Pt asleep in bed. Respirations even and unlabored. Monitoring for safety. 

## 2022-11-04 NOTE — ED Notes (Signed)
Pt is in the bed sleeping. Respirations are even and unlabored. No acute distress noted. Will continue to monitor for safety. 
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
## 2022-11-04 NOTE — ED Notes (Signed)
Snacks given 

## 2022-11-04 NOTE — ED Notes (Signed)
Pt in bedroom calm, sitting on bed. Pt states being bored. Suggested pt play board games, color. Pt stated he would call his mother to bring him his favorite coloring book. Writer offered to print out [employer] but pt refused. No other acute distress noted. Safety maintained.

## 2022-11-04 NOTE — ED Notes (Signed)
Writer attempted blood draw x2 unsuccessful. Encouraged pt to drink plenty of H2O. Pt verbalized understanding and agreement.

## 2022-11-04 NOTE — BH IP Treatment Plan (Signed)
Interdisciplinary Treatment and Diagnostic Plan Update  11/04/2022 Time of Session: 11:45AM Warren Hess MRN: [id number]  Diagnosis:  Final diagnoses:  Substance induced mood disorder (HCC)  Amphetamine use disorder, moderate, in controlled environment, dependence (HCC)  Moderate episode of recurrent major depressive disorder (HCC)     Current Medications:  Current Facility-Administered Medications  Medication Dose Route Frequency Provider Last Rate Last Admin   acetaminophen (TYLENOL) tablet 650 mg  650 mg Oral Q6H PRN Rankin, Shuvon B, NP       alum & mag hydroxide-simeth (MAALOX/MYLANTA) 200-200-20 MG/5ML suspension 30 mL  30 mL Oral Q4H PRN Rankin, Shuvon B, NP       hydrOXYzine (ATARAX) tablet 25 mg  25 mg Oral TID PRN Rankin, Shuvon B, NP   25 mg at 11/04/22 1229   ibuprofen (ADVIL) tablet 600 mg  600 mg Oral Q6H PRN Lamar Sprinkles, MD       magnesium hydroxide (MILK OF MAGNESIA) suspension 30 mL  30 mL Oral Daily PRN Rankin, Shuvon B, NP       melatonin tablet 10 mg  10 mg Oral QHS Rankin, Shuvon B, NP   10 mg at 11/03/22 2123   mirtazapine (REMERON) tablet 15 mg  15 mg Oral QHS Rankin, Shuvon B, NP   15 mg at 11/03/22 2123   nicotine polacrilex (NICORETTE) gum 4 mg  4 mg Oral PRN Lamar Sprinkles, MD   4 mg at 11/04/22 1428   pregabalin (LYRICA) capsule 50 mg  50 mg Oral BID Rankin, Shuvon B, NP   50 mg at 11/04/22 0924   sertraline (ZOLOFT) tablet 25 mg  25 mg Oral Daily Rankin, Shuvon B, NP   25 mg at 11/04/22 0924   traZODone (DESYREL) tablet 50 mg  50 mg Oral QHS PRN Rankin, Shuvon B, NP   50 mg at 11/03/22 2126   Current Outpatient Medications  Medication Sig Dispense Refill   ibuprofen (ADVIL) 200 MG tablet Take 800 mg by mouth every 6 (six) hours as needed for headache.     imiquimod (ALDARA) 5 % cream Apply 1 Application topically at bedtime.     MELATONIN GUMMIES PO Take 2 tablets by mouth at bedtime.     mirtazapine (REMERON) 15 MG tablet Take 15-30 mg by mouth  at bedtime as needed (For sleep).     pregabalin (LYRICA) 75 MG capsule Take 75 mg by mouth daily.     sertraline (ZOLOFT) 50 MG tablet Take 50 mg by mouth daily.     PTA Medications: Prior to Admission medications   Medication Sig Start Date End Date Taking? Authorizing Provider  ibuprofen (ADVIL) 200 MG tablet Take 800 mg by mouth every 6 (six) hours as needed for headache.   Yes [provider]  imiquimod (ALDARA) 5 % cream Apply 1 Application topically at bedtime. 07/24/22  Yes [provider]  MELATONIN GUMMIES PO Take 2 tablets by mouth at bedtime.   Yes [provider]  mirtazapine (REMERON) 15 MG tablet Take 15-30 mg by mouth at bedtime as needed (For sleep). 08/15/20  Yes [provider]  pregabalin (LYRICA) 75 MG capsule Take 75 mg by mouth daily.   Yes [provider]  sertraline (ZOLOFT) 50 MG tablet Take 50 mg by mouth daily.   Yes [provider]    Patient Stressors: Financial difficulties   Substance abuse    Patient Strengths: Ability for insight  Average or above average intelligence  Capable of independent living  General fund of knowledge  Supportive family/friends   Treatment Modalities: Medication Management, Group therapy, Case management,  1 to 1 session with clinician, Psychoeducation, Recreational therapy.   Physician Treatment Plan for Primary and Secondary Diagnosis:  Final diagnoses:  Substance induced mood disorder (HCC)  Amphetamine use disorder, moderate, in controlled environment, dependence (HCC)  Moderate episode of recurrent major depressive disorder (HCC)   Long Term Goal(s): Improvement in symptoms so as ready for discharge  Short Term Goals: Patient will verbalize feelings in meetings with treatment team members. Patient will attend at least of 50% of the groups daily. Pt will complete the PHQ9 on admission, day 3 and discharge. Patient will participate in completing the [place] Suicide  Severity Rating Scale Patient will score a low risk of violence for 24 hours prior to discharge Patient will take medications as prescribed daily.  Medication Management: Evaluate patient's response, side effects, and tolerance of medication regimen.  Therapeutic Interventions: 1 to 1 sessions, Unit Group sessions and Medication administration.  Evaluation of Outcomes: Progressing  LCSW Treatment Plan for Primary Diagnosis:  Final diagnoses:  Substance induced mood disorder (HCC)  Amphetamine use disorder, moderate, in controlled environment, dependence (HCC)  Moderate episode of recurrent major depressive disorder (HCC)    Long Term Goal(s): Safe transition to appropriate next level of care at discharge.  Short Term Goals: Facilitate acceptance of mental health diagnosis and concerns through verbal commitment to aftercare plan and appointments at discharge., Patient will identify one social support prior to discharge to aid in patient's recovery., Patient will attend AA/NA groups as scheduled., Identify minimum of 2 triggers associated with mental health/substance abuse issues with treatment team members., and Increase skills for wellness and recovery by attending 50% of scheduled groups.  Therapeutic Interventions: Assess for all discharge needs, 1 to 1 time with Child psychotherapist, Explore available resources and support systems, Assess for adequacy in community support network, Educate family and significant other(s) on suicide prevention, Complete Psychosocial Assessment, Interpersonal group therapy.  Evaluation of Outcomes: Progressing   Progress in Treatment: Attending groups: Yes. Participating in groups: Yes. Taking medication as prescribed: Yes. Toleration medication: Yes. Family/Significant other contact made: No, will contact:  Patient provided permission for LCSW to speak with his mother Warren Hess to gain collateral [phone] Patient understands diagnosis:  Yes. Discussing patient identified problems/goals with staff: Yes. Medical problems stabilized or resolved: Yes. Denies suicidal/homicidal ideation: Yes. Issues/concerns per patient self-inventory: Yes. Other: substance and need for treatment  New problem(s) identified: No, Describe:  other than reported on admission  New Short Term/Long Term Goal(s): Safe transition to appropriate next level of care at discharge, Engage patient in therapeutic group addressing interpersonal concerns. Engage patient in aftercare planning with referrals and resources, Increase ability to appropriately verbalize feelings, Facilitate acceptance of mental health diagnosis and concerns and Identify triggers associated with mental health/substance abuse issues.    Patient Goals:  Patient is seeking residential placement at this time for substance use.   Discharge Plan or Barriers: Referrals have been sent to the following facilities for review: [facility], [facility] in [place], and [facility]  Reason for Continuation of Hospitalization: Medication stabilization Withdrawal symptoms Other; describe residential placement  Estimated Length of Stay: 3-5 days  Last 3 [place] Suicide Severity Risk Score: Flowsheet Row ED from 11/03/2022 in [facility] Admission (Discharged) from 10/22/2021 in [place] [facility] ED from 09/14/2021 in [facility] EMERGENCY DEPARTMENT  C-SSRS RISK CATEGORY No Risk No  Risk No Risk       Last PHQ 2/9 Scores:    11/03/2022    9:39 AM  Depression screen PHQ 2/9  Decreased Interest 3  Down, Depressed, Hopeless 3  PHQ - 2 Score 6  Altered sleeping 0  Tired, decreased energy 2  Change in appetite 3  Feeling bad or failure about yourself  0  Trouble concentrating 0  Moving slowly or fidgety/restless 2  Suicidal thoughts 1  PHQ-9 Score 14  Difficult doing work/chores Very difficult    Scribe for  Treatment Team: Lenny Pastel 11/04/2022 5:02 PM

## 2022-11-04 NOTE — ED Notes (Signed)
  Patient attended group, the title was Journey to Recovery, I covered Self-Direction, Empowerment, Individualized & person-centered, non-linear stating that recovery is a continual growth process, Holistic recovery includes the patients whole life including the mind, spirit and body, mental health, health care treatment, family all aspects of the patients life. Strength based recovery coping with the patient's abilities, resiliencies and building on the strengths they were born with and working with them to help in the healing process. Peer support is important, Respect for themselves and others, Responsibility for their own self care and their personal journey to recovery, no one can do it for them, it's a journey they have to take alone. We discussed hope and what it means to them, it's different for everyone, the patients expressed their hopes for the future. There are workbooks for them to work on in their spare time. I expressed to them that they have the ability to get thru this difficult time, with endurence and time. It will take a lot of time for this to happen, because there will be setbacks and people trying to get in the way, leave them behind and move forward. You are stronger than you think. The drugs and alcohol did not take you because you have to will power to survive and thrive. We discussed Triggers which was difficult for some, but we go thru it. We were able to identify certain triggers and worked on how to avoid them together. I told them they were smarter than most, and had the ability to see what other could not. They can look at a complete stranger and tell you if they are on drugs or alcohol, their brains are wide open, once they are off the substances they can do anything. People pay a lot of money to use both sides of their brains. We ended with a discussion on finding activities to do that would reduce the triggers and take the stress and boredom away. We talked about going on walks,  getting a pet, one patient wanted to get back in to bike riding. We had a good discussion. Everyone in the group enjoyed it.   

## 2022-11-04 NOTE — ED Notes (Signed)
Patient A&Ox4. Denies intent to harm self/others when asked. Denies A/VH. Pt observed scratching self, fidgety and restless. Pt states, "I'm bored. I can't stay still. How long do I have to stay here? I'm ready to get to the next process". Informed pt of 3-5 day day stay prior to transport to residential tx program. Pt states, "I want to be out by Christmas". Iterated to pt importance of staying patient and completing program for better chance at success. Pt verbalized agreement. Support and encouragement provided. Routine safety checks conducted according to facility protocol. Encouraged patient to notify staff if thoughts of harm toward self or others arise. Patient verbalize understanding and agreement. Will continue to monitor for safety.
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
## 2022-11-04 NOTE — ED Notes (Signed)
Pt received prn medication for anxiety per pt request. Pt presented restless. Writer printed off coloring sheets for pt. Pt currently in room coloring. Will continue to monitor for safety.

## 2022-11-04 NOTE — ED Notes (Signed)
Snack given and juice.

## 2022-11-05 DIAGNOSIS — F1994 Other psychoactive substance use, unspecified with psychoactive substance-induced mood disorder: Secondary | ICD-10-CM | POA: Diagnosis not present

## 2022-11-05 DIAGNOSIS — F17210 Nicotine dependence, cigarettes, uncomplicated: Secondary | ICD-10-CM | POA: Diagnosis not present

## 2022-11-05 DIAGNOSIS — Z1152 Encounter for screening for COVID-19: Secondary | ICD-10-CM | POA: Diagnosis not present

## 2022-11-05 DIAGNOSIS — Z9151 Personal history of suicidal behavior: Secondary | ICD-10-CM | POA: Diagnosis not present

## 2022-11-05 DIAGNOSIS — Z89511 Acquired absence of right leg below knee: Secondary | ICD-10-CM | POA: Diagnosis not present

## 2022-11-05 DIAGNOSIS — Z79899 Other long term (current) drug therapy: Secondary | ICD-10-CM | POA: Diagnosis not present

## 2022-11-05 LAB — CBC WITH DIFFERENTIAL/PLATELET
Abs Immature Granulocytes: 0.01 10*3/uL (ref 0.00–0.07)
Basophils Absolute: 0 10*3/uL (ref 0.0–0.1)
Basophils Relative: 0 %
Eosinophils Absolute: 0.2 10*3/uL (ref 0.0–0.5)
Eosinophils Relative: 2 %
HCT: 39.6 % (ref 39.0–52.0)
Hemoglobin: 13.5 g/dL (ref 13.0–17.0)
Immature Granulocytes: 0 %
Lymphocytes Relative: 52 %
Lymphs Abs: 3.4 10*3/uL (ref 0.7–4.0)
MCH: 29.2 pg (ref 26.0–34.0)
MCHC: 34.1 g/dL (ref 30.0–36.0)
MCV: 85.7 fL (ref 80.0–100.0)
Monocytes Absolute: 0.8 10*3/uL (ref 0.1–1.0)
Monocytes Relative: 12 %
Neutro Abs: 2.3 10*3/uL (ref 1.7–7.7)
Neutrophils Relative %: 34 %
Platelets: 217 10*3/uL (ref 150–400)
RBC: 4.62 MIL/uL (ref 4.22–5.81)
RDW: 12.9 % (ref 11.5–15.5)
WBC: 6.7 10*3/uL (ref 4.0–10.5)
nRBC: 0 % (ref 0.0–0.2)

## 2022-11-05 LAB — LIPID PANEL
Cholesterol: 133 mg/dL (ref 0–200)
HDL: 29 mg/dL — ABNORMAL LOW (ref >40)
LDL Cholesterol: 69 mg/dL (ref 0–99)
Total CHOL/HDL Ratio: 4.6 RATIO
Triglycerides: 177 mg/dL — ABNORMAL HIGH (ref <150)
VLDL: 35 mg/dL (ref 0–40)

## 2022-11-05 LAB — TSH: TSH: 0.886 u[IU]/mL (ref 0.350–4.500)

## 2022-11-05 LAB — COMPREHENSIVE METABOLIC PANEL
ALT: 20 U/L (ref 0–44)
AST: 19 U/L (ref 15–41)
Albumin: 3.6 g/dL (ref 3.5–5.0)
Alkaline Phosphatase: 67 U/L (ref 38–126)
Anion gap: 10 (ref 5–15)
BUN: 14 mg/dL (ref 6–20)
CO2: 27 mmol/L (ref 22–32)
Calcium: 9.2 mg/dL (ref 8.9–10.3)
Chloride: 101 mmol/L (ref 98–111)
Creatinine, Ser: 0.57 mg/dL — ABNORMAL LOW (ref 0.61–1.24)
GFR, Estimated: >60 mL/min (ref >60)
Glucose, Bld: 100 mg/dL — ABNORMAL HIGH (ref 70–99)
Potassium: 3.7 mmol/L (ref 3.5–5.1)
Sodium: 138 mmol/L (ref 135–145)
Total Bilirubin: 0.5 mg/dL (ref 0.3–1.2)
Total Protein: 6.2 g/dL — ABNORMAL LOW (ref 6.5–8.1)

## 2022-11-05 LAB — ETHANOL: Alcohol, Ethyl (B): <10 mg/dL (ref <10)

## 2022-11-05 LAB — MAGNESIUM: Magnesium: 2.2 mg/dL (ref 1.7–2.4)

## 2022-11-05 NOTE — ED Notes (Signed)
Pt sleeping in no acute distress. RR even and unlabored. Environment secured. Will continue to monitor for safety. 

## 2022-11-05 NOTE — ED Notes (Signed)
Pt in dayroom eating lunch in no acute distress. Denies needs or concerns at present. Informed pt to notify staff with any assistance. Pt verbalized understanding and agreement. Will continue to monitor for safety.

## 2022-11-05 NOTE — Discharge Planning (Signed)
Patient has been accepted to [facility] [phone] and can admit into their facility on tomorrow. Patient provided update and reports his mother can transport him to the facility on tomorrow. LCSW contacted patient's mother Willet Schleifer [phone] to confirm and mother is in agreement with plan. Mother reports she will arrive to the [facility] at 8:00am to transport that patient. No concerns were reported by mother. LCSW followed up with [facility] to confirm arrival time on tomorrow. MD and staff provided updated. No other needs were reported.   LCSW will continue to follow and provide support to patient while in the [facility].   Fernande Boyden, LCSW Clinical Social Worker [place] [facility] Ph: [phone]

## 2022-11-05 NOTE — ED Notes (Signed)
Pt is attending AA. 

## 2022-11-05 NOTE — ED Notes (Signed)
Pt asleep in bed. Respirations even and unlabored. Monitoring for safety. 
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
## 2022-11-05 NOTE — Discharge Planning (Signed)
LCSW attempted to follow up regarding the referrals sent for placement, however was informed by all three facilities that fax was not received on yesterday. LCSW resent referral for review and will follow up regarding updates at a later time.   Referral has been sent to the following facilities for review:  [facility]  Fernande Boyden, [place] Clinical Social Worker [facility] Ph: [phone]

## 2022-11-05 NOTE — ED Notes (Signed)
Pt requested something for increased anxiety. RN asked pt to identify stressor causing increased anxiety and try non-pharmacological techniques to manage anixety. Pt states, "I tried coloring but I'm tired of that right now. I do deep breathing exercises sometimes but that medicine seem to help me more than anything. I need a cigarette". Writer provided education on consequences to pt's health with smoking nicotine. Pt states, "I'll try to get off drugs first, then I'll try to stop smoking". Encouragement provided and informed pt to take it one step at a time. Praise given for pt's efforts. Pt received Vistaril for increased anxiety. Informed pt to notify staff with any needs or concerns. Safety maintained.

## 2022-11-05 NOTE — ED Notes (Signed)
Pt resting quietly, breathing is even and unlabored.  Pt denies SI, HI, pain and AVH.  Pt states he is ready for discharge whenever a disposition is made.  Will continue to monitor for safety.

## 2022-11-05 NOTE — ED Notes (Signed)
Pt sitting in dining room watching TV. A&O x4, calm and cooperative. Denies current SI/HI/AVH. No signs of distress noted. Monitoring for safety.  

## 2022-11-05 NOTE — ED Notes (Signed)
Pt is in the bed sleeping. Respirations are even and unlabored. No acute distress noted. Will continue to monitor for safety. 

## 2022-11-06 DIAGNOSIS — Z89511 Acquired absence of right leg below knee: Secondary | ICD-10-CM | POA: Diagnosis not present

## 2022-11-06 DIAGNOSIS — Z79899 Other long term (current) drug therapy: Secondary | ICD-10-CM | POA: Diagnosis not present

## 2022-11-06 DIAGNOSIS — Z1152 Encounter for screening for COVID-19: Secondary | ICD-10-CM | POA: Diagnosis not present

## 2022-11-06 DIAGNOSIS — Z9151 Personal history of suicidal behavior: Secondary | ICD-10-CM | POA: Diagnosis not present

## 2022-11-06 DIAGNOSIS — F17210 Nicotine dependence, cigarettes, uncomplicated: Secondary | ICD-10-CM | POA: Diagnosis not present

## 2022-11-06 DIAGNOSIS — F1994 Other psychoactive substance use, unspecified with psychoactive substance-induced mood disorder: Secondary | ICD-10-CM | POA: Diagnosis not present

## 2022-11-06 LAB — HEMOGLOBIN A1C
Hgb A1c MFr Bld: 5.4 % (ref 4.8–5.6)
Mean Plasma Glucose: 108 mg/dL

## 2022-11-06 MED ORDER — TRAZODONE HCL 50 MG PO TABS: 50.0000 mg | ORAL_TABLET | Freq: Every evening | ORAL | 0 refills | Status: DC | PRN

## 2022-11-06 MED ORDER — MELATONIN 10 MG PO TABS: 10.0000 mg | ORAL_TABLET | Freq: Every day | ORAL | 0 refills | Status: AC

## 2022-11-06 MED ORDER — HYDROXYZINE HCL 25 MG PO TABS: 25.0000 mg | ORAL_TABLET | Freq: Three times a day (TID) | ORAL | 0 refills | Status: DC | PRN

## 2022-11-06 MED ORDER — NICOTINE POLACRILEX 4 MG MT GUM: 4.0000 mg | CHEWING_GUM | OROMUCOSAL | 0 refills | Status: DC | PRN

## 2022-11-06 MED ORDER — SERTRALINE HCL 25 MG PO TABS: 25.0000 mg | ORAL_TABLET | Freq: Every day | ORAL | 0 refills | Status: DC

## 2022-11-06 MED ORDER — MELATONIN 10 MG PO TABS: 10.0000 mg | ORAL_TABLET | Freq: Every day | ORAL | 0 refills | Status: DC

## 2022-11-06 MED ORDER — MIRTAZAPINE 15 MG PO TABS: 15.0000 mg | ORAL_TABLET | Freq: Every day | ORAL | 0 refills | Status: DC

## 2022-11-06 MED ORDER — PREGABALIN 50 MG PO CAPS: 50.0000 mg | ORAL_CAPSULE | Freq: Two times a day (BID) | ORAL | 0 refills | Status: DC

## 2022-11-06 NOTE — ED Notes (Signed)
Pt asleep in bed. Respirations even and unlabored. Monitoring for safety. 

## 2022-11-06 NOTE — Discharge Instructions (Signed)
Follow-up recommendations:  Activity:  Normal, as tolerated Diet:  Per PCP recommendation  Patient is instructed prior to discharge to: Take all medications as prescribed by his mental healthcare provider. Report any adverse effects and/or reactions from the medicines to his outpatient provider promptly. Patient has been instructed & cautioned: To not engage in alcohol and or illegal drug use while on prescription medicines.  In the event of worsening symptoms, patient is instructed to call the crisis hotline at 988, 911 and or go to the nearest ED for appropriate evaluation and treatment of symptoms. To follow-up with his primary care provider for your other medical issues, concerns and or health care needs.   Naloxone (Narcan) can help reverse an overdose when given to the victim quickly.  [place] offers free naloxone kits and instructions/training on its use.  Add naloxone to your first aid kit and you can help save a life.   Pick up your free kit at the following locations.   [place]:  [facility], [address] [place] [address] ([phone]) [facility] [address] [place] [id number] ([phone]) [facility] [address] [place] [address]  [place]: [facility] [address] [place] [address] ([phone]) [facility] [address] [place] [address] ([phone])  
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
## 2022-11-09 DIAGNOSIS — M84475K Pathological fracture, left foot, subsequent encounter for fracture with nonunion: Secondary | ICD-10-CM | POA: Diagnosis not present

## 2022-11-09 DIAGNOSIS — S88119A Complete traumatic amputation at level between knee and ankle, unspecified lower leg, initial encounter: Secondary | ICD-10-CM | POA: Diagnosis not present

## 2022-11-09 DIAGNOSIS — R531 Weakness: Secondary | ICD-10-CM | POA: Diagnosis not present

## 2022-12-11 DIAGNOSIS — F5101 Primary insomnia: Secondary | ICD-10-CM | POA: Diagnosis not present

## 2022-12-11 DIAGNOSIS — G546 Phantom limb syndrome with pain: Secondary | ICD-10-CM | POA: Diagnosis not present

## 2022-12-16 DIAGNOSIS — F1191 Opioid use, unspecified, in remission: Secondary | ICD-10-CM | POA: Diagnosis not present

## 2023-01-05 DIAGNOSIS — F1191 Opioid use, unspecified, in remission: Secondary | ICD-10-CM | POA: Diagnosis not present

## 2023-02-03 DIAGNOSIS — S02412A LeFort II fracture, initial encounter for closed fracture: Secondary | ICD-10-CM | POA: Diagnosis not present

## 2023-02-03 DIAGNOSIS — G546 Phantom limb syndrome with pain: Secondary | ICD-10-CM | POA: Diagnosis not present

## 2023-02-03 DIAGNOSIS — S02411A LeFort I fracture, initial encounter for closed fracture: Secondary | ICD-10-CM | POA: Diagnosis not present

## 2023-02-03 DIAGNOSIS — F1191 Opioid use, unspecified, in remission: Secondary | ICD-10-CM | POA: Diagnosis not present

## 2023-02-18 DIAGNOSIS — Z89511 Acquired absence of right leg below knee: Secondary | ICD-10-CM | POA: Diagnosis not present

## 2023-03-06 DIAGNOSIS — F1191 Opioid use, unspecified, in remission: Secondary | ICD-10-CM | POA: Diagnosis not present

## 2023-03-18 DIAGNOSIS — M79661 Pain in right lower leg: Secondary | ICD-10-CM | POA: Diagnosis not present

## 2023-04-01 DIAGNOSIS — M79661 Pain in right lower leg: Secondary | ICD-10-CM | POA: Diagnosis not present

## 2023-04-03 DIAGNOSIS — S88111D Complete traumatic amputation at level between knee and ankle, right lower leg, subsequent encounter: Secondary | ICD-10-CM | POA: Diagnosis not present

## 2023-04-03 DIAGNOSIS — F1191 Opioid use, unspecified, in remission: Secondary | ICD-10-CM | POA: Diagnosis not present

## 2023-04-06 DIAGNOSIS — F1191 Opioid use, unspecified, in remission: Secondary | ICD-10-CM | POA: Diagnosis not present

## 2023-04-10 DIAGNOSIS — Z89511 Acquired absence of right leg below knee: Secondary | ICD-10-CM | POA: Diagnosis not present

## 2023-04-10 DIAGNOSIS — M79661 Pain in right lower leg: Secondary | ICD-10-CM | POA: Diagnosis not present

## 2023-04-16 DIAGNOSIS — Z0000 Encounter for general adult medical examination without abnormal findings: Secondary | ICD-10-CM | POA: Diagnosis not present

## 2023-04-21 DIAGNOSIS — Z0000 Encounter for general adult medical examination without abnormal findings: Secondary | ICD-10-CM | POA: Diagnosis not present

## 2023-04-22 DIAGNOSIS — M25531 Pain in right wrist: Secondary | ICD-10-CM | POA: Diagnosis not present

## 2023-04-23 DIAGNOSIS — Z1329 Encounter for screening for other suspected endocrine disorder: Secondary | ICD-10-CM | POA: Diagnosis not present

## 2023-05-02 DIAGNOSIS — Z79899 Other long term (current) drug therapy: Secondary | ICD-10-CM | POA: Diagnosis not present

## 2023-05-02 DIAGNOSIS — M79644 Pain in right finger(s): Secondary | ICD-10-CM | POA: Diagnosis not present

## 2023-05-02 DIAGNOSIS — Z720 Tobacco use: Secondary | ICD-10-CM | POA: Diagnosis not present

## 2023-05-02 DIAGNOSIS — M79643 Pain in unspecified hand: Secondary | ICD-10-CM | POA: Diagnosis not present

## 2023-05-02 DIAGNOSIS — M65311 Trigger thumb, right thumb: Secondary | ICD-10-CM | POA: Diagnosis not present

## 2023-05-06 DIAGNOSIS — M654 Radial styloid tenosynovitis [de Quervain]: Secondary | ICD-10-CM | POA: Diagnosis not present

## 2023-05-06 DIAGNOSIS — F1191 Opioid use, unspecified, in remission: Secondary | ICD-10-CM | POA: Diagnosis not present

## 2023-05-09 DIAGNOSIS — M25531 Pain in right wrist: Secondary | ICD-10-CM | POA: Diagnosis not present

## 2023-05-09 DIAGNOSIS — M79644 Pain in right finger(s): Secondary | ICD-10-CM | POA: Diagnosis not present

## 2023-05-09 DIAGNOSIS — M654 Radial styloid tenosynovitis [de Quervain]: Secondary | ICD-10-CM | POA: Diagnosis not present

## 2023-05-09 DIAGNOSIS — S52601A Unspecified fracture of lower end of right ulna, initial encounter for closed fracture: Secondary | ICD-10-CM | POA: Diagnosis not present

## 2023-05-09 DIAGNOSIS — X58XXXA Exposure to other specified factors, initial encounter: Secondary | ICD-10-CM | POA: Diagnosis not present

## 2023-05-27 DIAGNOSIS — S66811A Strain of other specified muscles, fascia and tendons at wrist and hand level, right hand, initial encounter: Secondary | ICD-10-CM | POA: Diagnosis not present

## 2023-05-27 DIAGNOSIS — S52601A Unspecified fracture of lower end of right ulna, initial encounter for closed fracture: Secondary | ICD-10-CM | POA: Diagnosis not present

## 2023-07-16 DIAGNOSIS — S66011A Strain of long flexor muscle, fascia and tendon of right thumb at wrist and hand level, initial encounter: Secondary | ICD-10-CM | POA: Diagnosis not present

## 2023-07-16 DIAGNOSIS — M79644 Pain in right finger(s): Secondary | ICD-10-CM | POA: Diagnosis not present

## 2023-07-29 DIAGNOSIS — S66811A Strain of other specified muscles, fascia and tendons at wrist and hand level, right hand, initial encounter: Secondary | ICD-10-CM | POA: Diagnosis not present

## 2023-07-29 DIAGNOSIS — Z969 Presence of functional implant, unspecified: Secondary | ICD-10-CM | POA: Diagnosis not present

## 2023-07-30 ENCOUNTER — Other Ambulatory Visit: Payer: Self-pay | Admitting: Orthopedic Surgery

## 2023-08-20 ENCOUNTER — Encounter (HOSPITAL_BASED_OUTPATIENT_CLINIC_OR_DEPARTMENT_OTHER): Payer: Self-pay | Admitting: Orthopedic Surgery

## 2023-08-20 ENCOUNTER — Other Ambulatory Visit: Payer: Self-pay

## 2023-08-27 ENCOUNTER — Ambulatory Visit (HOSPITAL_BASED_OUTPATIENT_CLINIC_OR_DEPARTMENT_OTHER)
Admission: RE | Admit: 2023-08-27 | Discharge: 2023-08-27 | Disposition: A | Discharge status: Home / Self Care | Payer: Commercial Managed Care - PPO | Source: Ambulatory Visit | Attending: Orthopedic Surgery | Admitting: Orthopedic Surgery

## 2023-08-27 ENCOUNTER — Ambulatory Visit (HOSPITAL_BASED_OUTPATIENT_CLINIC_OR_DEPARTMENT_OTHER): Payer: Commercial Managed Care - PPO

## 2023-08-27 ENCOUNTER — Other Ambulatory Visit: Payer: Self-pay

## 2023-08-27 ENCOUNTER — Ambulatory Visit (HOSPITAL_BASED_OUTPATIENT_CLINIC_OR_DEPARTMENT_OTHER): Payer: Commercial Managed Care - PPO | Admitting: Anesthesiology

## 2023-08-27 ENCOUNTER — Encounter (HOSPITAL_BASED_OUTPATIENT_CLINIC_OR_DEPARTMENT_OTHER): Payer: Self-pay | Admitting: Orthopedic Surgery

## 2023-08-27 ENCOUNTER — Encounter (HOSPITAL_BASED_OUTPATIENT_CLINIC_OR_DEPARTMENT_OTHER)
Admission: RE | Disposition: A | Discharge status: Home / Self Care | Payer: Self-pay | Source: Ambulatory Visit | Attending: Orthopedic Surgery

## 2023-08-27 DIAGNOSIS — Z472 Encounter for removal of internal fixation device: Secondary | ICD-10-CM | POA: Diagnosis not present

## 2023-08-27 DIAGNOSIS — F17200 Nicotine dependence, unspecified, uncomplicated: Secondary | ICD-10-CM | POA: Insufficient documentation

## 2023-08-27 DIAGNOSIS — Z89511 Acquired absence of right leg below knee: Secondary | ICD-10-CM | POA: Diagnosis not present

## 2023-08-27 DIAGNOSIS — S66011A Strain of long flexor muscle, fascia and tendon of right thumb at wrist and hand level, initial encounter: Secondary | ICD-10-CM | POA: Insufficient documentation

## 2023-08-27 DIAGNOSIS — X58XXXA Exposure to other specified factors, initial encounter: Secondary | ICD-10-CM | POA: Diagnosis not present

## 2023-08-27 HISTORY — PX: FLEXOR TENDON REPAIR: SHX6501

## 2023-08-27 HISTORY — PX: HARDWARE REMOVAL: SHX979

## 2023-08-27 SURGERY — REPAIR, TENDON, FLEXOR
Anesthesia: General | Site: Wrist | Laterality: Right

## 2023-08-27 MED ORDER — OXYCODONE HCL 5 MG PO TABS
10.0000 mg | ORAL_TABLET | Freq: Once | ORAL | Status: AC
  Administered 2023-08-27: 10 mg via ORAL

## 2023-08-27 MED ORDER — EPHEDRINE 5 MG/ML INJ
INTRAVENOUS | Status: AC
  Filled 2023-08-27: qty 5

## 2023-08-27 MED ORDER — FENTANYL CITRATE (PF) 100 MCG/2ML IJ SOLN
25.0000 ug | INTRAMUSCULAR | Status: DC | PRN
  Administered 2023-08-27 (×3): 50 ug via INTRAVENOUS

## 2023-08-27 MED ORDER — SUCCINYLCHOLINE CHLORIDE 200 MG/10ML IV SOSY
PREFILLED_SYRINGE | INTRAVENOUS | Status: AC
  Filled 2023-08-27: qty 10

## 2023-08-27 MED ORDER — FENTANYL CITRATE (PF) 100 MCG/2ML IJ SOLN
INTRAMUSCULAR | Status: AC
  Filled 2023-08-27: qty 2

## 2023-08-27 MED ORDER — LACTATED RINGERS IV SOLN: INTRAVENOUS | Status: DC

## 2023-08-27 MED ORDER — FENTANYL CITRATE (PF) 100 MCG/2ML IJ SOLN
INTRAMUSCULAR | Status: DC | PRN
  Administered 2023-08-27 (×2): 50 ug via INTRAVENOUS
  Administered 2023-08-27: 100 ug via INTRAVENOUS

## 2023-08-27 MED ORDER — LIDOCAINE 2% (20 MG/ML) 5 ML SYRINGE
INTRAMUSCULAR | Status: AC
  Filled 2023-08-27: qty 5

## 2023-08-27 MED ORDER — ACETAMINOPHEN 500 MG PO TABS
ORAL_TABLET | ORAL | Status: AC
  Filled 2023-08-27: qty 2

## 2023-08-27 MED ORDER — MIDAZOLAM HCL 5 MG/5ML IJ SOLN
INTRAMUSCULAR | Status: DC | PRN
  Administered 2023-08-27: 2 mg via INTRAVENOUS

## 2023-08-27 MED ORDER — KETOROLAC TROMETHAMINE 30 MG/ML IJ SOLN
30.0000 mg | Freq: Once | INTRAMUSCULAR | Status: AC | PRN
  Administered 2023-08-27: 30 mg via INTRAVENOUS

## 2023-08-27 MED ORDER — OXYCODONE HCL 5 MG PO TABS
ORAL_TABLET | ORAL | Status: AC
  Filled 2023-08-27: qty 2

## 2023-08-27 MED ORDER — MIDAZOLAM HCL 2 MG/2ML IJ SOLN
INTRAMUSCULAR | Status: AC
  Filled 2023-08-27: qty 2

## 2023-08-27 MED ORDER — DEXAMETHASONE SODIUM PHOSPHATE 10 MG/ML IJ SOLN
INTRAMUSCULAR | Status: AC
  Filled 2023-08-27: qty 1

## 2023-08-27 MED ORDER — ACETAMINOPHEN 500 MG PO TABS
1000.0000 mg | ORAL_TABLET | Freq: Once | ORAL | Status: AC
  Administered 2023-08-27: 1000 mg via ORAL

## 2023-08-27 MED ORDER — 0.9 % SODIUM CHLORIDE (POUR BTL) OPTIME
TOPICAL | Status: DC | PRN
Start: 2023-08-27 — End: 2023-08-27
  Administered 2023-08-27: 200 mL

## 2023-08-27 MED ORDER — OXYCODONE-ACETAMINOPHEN 5-325 MG PO TABS
ORAL_TABLET | ORAL | 0 refills | Status: DC
Start: 2023-08-27 — End: 2024-01-07

## 2023-08-27 MED ORDER — METHOCARBAMOL 1000 MG/10ML IJ SOLN
1000.0000 mg | Freq: Once | INTRAVENOUS | Status: AC
  Administered 2023-08-27: 1000 mg via INTRAVENOUS
  Filled 2023-08-27: qty 10

## 2023-08-27 MED ORDER — CEFAZOLIN IN SODIUM CHLORIDE 3-0.9 GM/100ML-% IV SOLN
INTRAVENOUS | Status: AC
  Filled 2023-08-27: qty 100

## 2023-08-27 MED ORDER — PHENYLEPHRINE 80 MCG/ML (10ML) SYRINGE FOR IV PUSH (FOR BLOOD PRESSURE SUPPORT)
PREFILLED_SYRINGE | INTRAVENOUS | Status: AC
  Filled 2023-08-27: qty 10

## 2023-08-27 MED ORDER — METHOCARBAMOL 1000 MG/10ML IJ SOLN
INTRAMUSCULAR | Status: AC
  Filled 2023-08-27: qty 10

## 2023-08-27 MED ORDER — DEXMEDETOMIDINE HCL IN NACL 80 MCG/20ML IV SOLN
INTRAVENOUS | Status: AC
  Filled 2023-08-27: qty 20

## 2023-08-27 MED ORDER — AMISULPRIDE (ANTIEMETIC) 5 MG/2ML IV SOLN: 10.0000 mg | Freq: Once | INTRAVENOUS | Status: DC | PRN

## 2023-08-27 MED ORDER — CEFAZOLIN IN SODIUM CHLORIDE 3-0.9 GM/100ML-% IV SOLN: 3.0000 g | Freq: Once | INTRAVENOUS | Status: DC

## 2023-08-27 MED ORDER — ONDANSETRON HCL 4 MG/2ML IJ SOLN
INTRAMUSCULAR | Status: AC
  Filled 2023-08-27: qty 2

## 2023-08-27 MED ORDER — BUPIVACAINE HCL (PF) 0.25 % IJ SOLN
INTRAMUSCULAR | Status: DC | PRN
  Administered 2023-08-27: 9 mL

## 2023-08-27 MED ORDER — CEFAZOLIN SODIUM-DEXTROSE 2-4 GM/100ML-% IV SOLN
2.0000 g | INTRAVENOUS | Status: DC
  Administered 2023-08-27: 2 g via INTRAVENOUS

## 2023-08-27 MED ORDER — DEXMEDETOMIDINE HCL IN NACL 80 MCG/20ML IV SOLN
INTRAVENOUS | Status: DC | PRN
Start: 2023-08-27 — End: 2023-08-27
  Administered 2023-08-27: 20 ug via INTRAVENOUS

## 2023-08-27 MED ORDER — OXYCODONE HCL 5 MG PO TABS: 5.0000 mg | ORAL_TABLET | Freq: Once | ORAL | Status: DC | PRN

## 2023-08-27 MED ORDER — KETOROLAC TROMETHAMINE 30 MG/ML IJ SOLN
INTRAMUSCULAR | Status: AC
  Filled 2023-08-27: qty 1

## 2023-08-27 MED ORDER — ATROPINE SULFATE 0.4 MG/ML IV SOLN
INTRAVENOUS | Status: AC
  Filled 2023-08-27: qty 1

## 2023-08-27 MED ORDER — DEXAMETHASONE SODIUM PHOSPHATE 10 MG/ML IJ SOLN
INTRAMUSCULAR | Status: DC | PRN
  Administered 2023-08-27: 8 mg via INTRAVENOUS

## 2023-08-27 MED ORDER — OXYCODONE HCL 5 MG/5ML PO SOLN: 5.0000 mg | Freq: Once | ORAL | Status: DC | PRN

## 2023-08-27 MED ORDER — LIDOCAINE HCL (CARDIAC) PF 100 MG/5ML IV SOSY
PREFILLED_SYRINGE | INTRAVENOUS | Status: DC | PRN
  Administered 2023-08-27: 60 mg via INTRAVENOUS

## 2023-08-27 MED ORDER — CEFAZOLIN SODIUM-DEXTROSE 2-4 GM/100ML-% IV SOLN
INTRAVENOUS | Status: AC
  Filled 2023-08-27: qty 100

## 2023-08-27 MED ORDER — ACETAMINOPHEN 500 MG PO TABS
ORAL_TABLET | ORAL | Status: AC
  Filled 2023-08-27: qty 1

## 2023-08-27 MED ORDER — ONDANSETRON HCL 4 MG/2ML IJ SOLN
INTRAMUSCULAR | Status: DC | PRN
  Administered 2023-08-27: 4 mg via INTRAVENOUS

## 2023-08-27 MED ORDER — FENTANYL CITRATE (PF) 100 MCG/2ML IJ SOLN
50.0000 ug | Freq: Once | INTRAMUSCULAR | Status: AC
  Administered 2023-08-27: 50 ug via INTRAVENOUS

## 2023-08-27 MED ORDER — PROPOFOL 10 MG/ML IV BOLUS
INTRAVENOUS | Status: DC | PRN
  Administered 2023-08-27: 200 mg via INTRAVENOUS

## 2023-08-27 SURGICAL SUPPLY — 45 items
APL PRP STRL LF DISP 70% ISPRP (MISCELLANEOUS) ×1
BLADE MINI RND TIP GREEN BEAV (BLADE) IMPLANT
BLADE SURG 15 STRL LF DISP TIS (BLADE) ×2 IMPLANT
BLADE SURG 15 STRL SS (BLADE) ×2
BNDG CMPR 5X3 KNIT ELC UNQ LF (GAUZE/BANDAGES/DRESSINGS) ×1
BNDG CMPR 9X4 STRL LF SNTH (GAUZE/BANDAGES/DRESSINGS) ×1
BNDG ELASTIC 3INX 5YD STR LF (GAUZE/BANDAGES/DRESSINGS) ×1 IMPLANT
BNDG ESMARK 4X9 LF (GAUZE/BANDAGES/DRESSINGS) ×1 IMPLANT
BNDG GAUZE DERMACEA FLUFF 4 (GAUZE/BANDAGES/DRESSINGS) ×1 IMPLANT
BNDG GZE DERMACEA 4 6PLY (GAUZE/BANDAGES/DRESSINGS) ×1
CHLORAPREP W/TINT 26 (MISCELLANEOUS) ×1 IMPLANT
CORD BIPOLAR FORCEPS 12FT (ELECTRODE) ×1 IMPLANT
COVER BACK TABLE 60X90IN (DRAPES) ×1 IMPLANT
COVER MAYO STAND STRL (DRAPES) ×1 IMPLANT
CUFF TOURN SGL QUICK 18X4 (TOURNIQUET CUFF) ×1 IMPLANT
DRAPE EXTREMITY T 121X128X90 (DISPOSABLE) ×1 IMPLANT
DRAPE OEC MINIVIEW 54X84 (DRAPES) IMPLANT
DRAPE SURG 17X23 STRL (DRAPES) ×1 IMPLANT
GAUZE SPONGE 4X4 12PLY STRL (GAUZE/BANDAGES/DRESSINGS) ×1 IMPLANT
GAUZE XEROFORM 1X8 LF (GAUZE/BANDAGES/DRESSINGS) ×1 IMPLANT
GLOVE BIO SURGEON STRL SZ7.5 (GLOVE) ×1 IMPLANT
GLOVE BIOGEL PI IND STRL 8 (GLOVE) ×1 IMPLANT
GLOVE BIOGEL PI IND STRL 8.5 (GLOVE) IMPLANT
GLOVE SURG ORTHO 8.0 STRL STRW (GLOVE) IMPLANT
GOWN STRL REUS W/ TWL LRG LVL3 (GOWN DISPOSABLE) ×1 IMPLANT
GOWN STRL REUS W/TWL LRG LVL3 (GOWN DISPOSABLE) ×1
GOWN STRL REUS W/TWL XL LVL3 (GOWN DISPOSABLE) ×1 IMPLANT
NDL HYPO 25X1 1.5 SAFETY (NEEDLE) IMPLANT
NEEDLE HYPO 25X1 1.5 SAFETY (NEEDLE) ×1
NS IRRIG 1000ML POUR BTL (IV SOLUTION) ×1 IMPLANT
PACK BASIN DAY SURGERY FS (CUSTOM PROCEDURE TRAY) ×1 IMPLANT
PAD CAST 3X4 CTTN HI CHSV (CAST SUPPLIES) ×1 IMPLANT
PADDING CAST ABS COTTON 3X4 (CAST SUPPLIES) IMPLANT
PADDING CAST ABS COTTON 4X4 ST (CAST SUPPLIES) ×1 IMPLANT
PADDING CAST COTTON 3X4 STRL (CAST SUPPLIES) ×1
SLEEVE SCD COMPRESS KNEE MED (STOCKING) IMPLANT
SPLINT PLASTER CAST XFAST 3X15 (CAST SUPPLIES) IMPLANT
STOCKINETTE 4X48 STRL (DRAPES) ×1 IMPLANT
SUT ETHIBOND 3-0 V-5 (SUTURE) IMPLANT
SUT ETHILON 4 0 PS 2 18 (SUTURE) ×1 IMPLANT
SUT VIC AB 4-0 PS2 18 (SUTURE) IMPLANT
SYR BULB EAR ULCER 3OZ GRN STR (SYRINGE) ×1 IMPLANT
SYR CONTROL 10ML LL (SYRINGE) IMPLANT
TOWEL GREEN STERILE FF (TOWEL DISPOSABLE) ×2 IMPLANT
UNDERPAD 30X36 HEAVY ABSORB (UNDERPADS AND DIAPERS) ×1 IMPLANT

## 2023-08-27 NOTE — Op Note (Signed)
NAME: Warren Hess MEDICAL RECORD NO: [id number] DATE OF BIRTH: [birth year] FACILITY: [facility] LOCATION: [facility] PHYSICIAN: Tami Ribas, MD   OPERATIVE REPORT   DATE OF PROCEDURE: 08/27/23    PREOPERATIVE DIAGNOSIS: Right FPL rupture and retained hardware   POSTOPERATIVE DIAGNOSIS: Right FPL rupture and retained hardware   PROCEDURE: 1.  Right flexor pollicis longus reconstruction with palmaris longus graft 2.  Removal of right volar distal radius locking plate 3.  Right carpal tunnel release   SURGEON:  Betha Loa, M.D.   ASSISTANT: Cindee Salt, MD   ANESTHESIA:  General   INTRAVENOUS FLUIDS:  Per anesthesia flow sheet.   ESTIMATED BLOOD LOSS:  Minimal.   COMPLICATIONS:  None.   SPECIMENS:  none   TOURNIQUET TIME:    Total Tourniquet Time Documented: Upper Arm (Right) - 82 minutes Total: Upper Arm (Right) - 82 minutes    DISPOSITION:  Stable to PACU.   INDICATIONS: 32 year old male status post open reduction internal fixation of right distal radius with rupture of FPL tendon.  He wishes to proceed with removal of the hardware and reconstruction of the FPL tendon.  Risks, benefits and alternatives of surgery were discussed including the risks of blood loss, infection, damage to nerves, vessels, tendons, ligaments, bone for surgery, need for additional surgery, complications with wound healing, continued pain, stiffness.  He voiced understanding of these risks and elected to proceed.  OPERATIVE COURSE:  After being identified preoperatively by myself,  the patient and I agreed on the procedure and site of the procedure.  The surgical site was marked.  Surgical consent had been signed. Preoperative IV antibiotic prophylaxis was given. He was transferred to the operating room and placed on the operating table in supine position with the right upper extremity on an arm board.  General anesthesia was induced by the anesthesiologist.  Right upper  extremity was prepped and draped in normal sterile orthopedic fashion.  A surgical pause was performed between the surgeons, anesthesia, and operating room staff and all were in agreement as to the patient, procedure, and site of procedure.  Tourniquet at the proximal aspect of the extremity was inflated to 250 mmHg after exsanguination of the arm with an Esmarch bandage.  Previous incision was followed at the volar wrist.  This was carried in subcutaneous tissues by spreading technique.  There was scar in the subcutaneous tissues.  Bipolar electrocautery was used to obtain hemostasis.  The wound was extended distally to aid in visualization.  The superficial and deep portions of the FCR tendon sheath were incised and the FCR retracted ulnarly.  The FPL tendon was identified.  There was rupture of the tendon.  The incision was extended distally into the palm.  The median nerve was identified.  The transverse carpal ligament was incised from proximal to distal under direct visualization while protecting the median nerve.  The motor branch was identified and was intact.  The distal stump of the FPL tendon was identified at the base of the thenar eminence.  There was no prominence noted in this area.  The volar distal radial locking plate was identified and cleared of soft tissue coverage.  Screwdrivers used to remove all screws and the plate was removed.  Rongeurs were used to remove any bony ingrowth.  The palmaris longus tendon was harvested from distally.  It was then harvested using a tendon stripper.  This was then passed through the distal stump and a Pulvertaft weave pattern.  It was  placed through the distal hole singly and then crossed through the next hole.  Only 2 passes were able to be performed due to the location of the tendon rupture.  The weave was secured with 3-0 Ethibond suture in a horizontal mattress fashion.  The tendon was then placed back through the path of the FPL tendon and Pulvertaft weaved  into the proximal stump.  The proximal stump had been pulled to full length and allowed to retract 50%.  The thumb was placed in apposition to the tip of the long finger and the weave secured.  The thumb was able to be brought out into full extension.  A 3 pass weave was performed in the proximal stump.  The wound was then copiously irrigated with sterile saline.  Inverted erupted 4-0 Vicryl sutures were placed in subcutaneous tissues and skin was closed with 4-0 nylon in a horizontal mattress fashion.  He was then injected with quarter percent plain Marcaine to aid in postoperative analgesia.  Was dressed with sterile Xeroform 4 x 4's and wrapped with a Kerlix bandage.  Thumb spica splint was placed and wrapped with Kerlix and Ace bandage.  The tourniquet was deflated at 82 minutes.  Fingertips were pink with brisk capillary refill after deflation of tourniquet.  The operative  drapes were broken down.  The patient was awoken from anesthesia safely.  He was transferred back to the stretcher and taken to PACU in stable condition.  I will see him back in the office in 1 week for postoperative followup.  I will give him a prescription for Percocet 5/325 1-2 tabs PO q6 hours prn pain, dispense # 20.   Betha Loa, MD Electronically signed, 08/27/23

## 2023-08-27 NOTE — Transfer of Care (Signed)
Immediate Anesthesia Transfer of Care Note  Patient: Warren Hess  Procedure(s) Performed: RIGHT FLEXOR POLLICIS LONGUS RECONSTRUCTION (Right: Wrist) REMOVAL RETAINED HARDWARE RIGHT WRIST (Right: Wrist)  Patient Location: PACU  Anesthesia Type:General  Level of Consciousness: awake, alert , oriented, drowsy, and patient cooperative  Airway & Oxygen Therapy: Patient Spontanous Breathing and Patient connected to face mask oxygen  Post-op Assessment: Report given to RN and Post -op Vital signs reviewed and stable  Post vital signs: Reviewed and stable  Last Vitals:  Vitals Value Taken Time  BP    Temp    Pulse 98 08/27/23 1432  Resp 24 08/27/23 1432  SpO2 89 % 08/27/23 1432  Vitals shown include unfiled device data.  Last Pain:  Vitals:   08/27/23 0930  PainSc: 0-No pain      Patients Stated Pain Goal: 3 (08/27/23 0930)  Complications: No notable events documented.
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
## 2023-08-27 NOTE — Anesthesia Preprocedure Evaluation (Addendum)
Anesthesia Evaluation  Patient identified by MRN, date of birth, ID band Patient awake    Reviewed: Allergy & Precautions, NPO status , Patient's Chart, lab work & pertinent test results  Airway Mallampati: II  TM Distance: >3 FB Neck ROM: Full    Dental  (+) Chipped, Missing,    Pulmonary Current SmokerPatient did not abstain from smoking.   Pulmonary exam normal        Cardiovascular negative cardio ROS Normal cardiovascular exam     Neuro/Psych  PSYCHIATRIC DISORDERS  Depression    negative neurological ROS     GI/Hepatic negative GI ROS, Neg liver ROS,,,  Endo/Other  Patient on GLP-1 Agonist  Renal/GU negative Renal ROS     Musculoskeletal negative musculoskeletal ROS (+)    Abdominal  (+) + obese  Peds  Hematology negative hematology ROS (+)   Anesthesia Other Findings RIGHT FLEXOR POLLICIS LONGUS RUPTURE, RETAINED HARDWARE  Reproductive/Obstetrics                              Anesthesia Physical Anesthesia Plan  ASA: 2  Anesthesia Plan: General   Post-op Pain Management:    Induction: Intravenous  PONV Risk Score and Plan: 1 and Ondansetron, Dexamethasone, Midazolam and Treatment may vary due to age or medical condition  Airway Management Planned: LMA  Additional Equipment:   Intra-op Plan:   Post-operative Plan: Extubation in OR  Informed Consent: I have reviewed the patients History and Physical, chart, labs and discussed the procedure including the risks, benefits and alternatives for the proposed anesthesia with the patient or authorized representative who has indicated his/her understanding and acceptance.     Dental advisory given  Plan Discussed with: CRNA  Anesthesia Plan Comments:         Anesthesia Quick Evaluation

## 2023-08-27 NOTE — Op Note (Signed)
I assisted Surgeons and Role:    * Betha Loa, MD - Primary    * Cindee Salt, MD - Assisting on the Procedure(s): RIGHT FLEXOR POLLICIS LONGUS RECONSTRUCTION REMOVAL RETAINED HARDWARE RIGHT WRIST on 08/27/2023.  I provided assistance on this case as follows: Set up, approach, retraction the radial artery, identification of the rupture flexor pollicis longus tendon proximally and distally, identification of the plate with removal, harvesting of the palmaris longus tendon, grafting of the flexor pollicis longus tendon, closure of the wound and application of the dressing and splint.  Electronically signed by: Cindee Salt, MD Date: 08/27/2023 Time: 2:23 PM

## 2023-08-27 NOTE — Anesthesia Procedure Notes (Signed)
Procedure Name: LMA Insertion Date/Time: 08/27/2023 12:44 PM  Performed by: Ronnette Hila, CRNAPre-anesthesia Checklist: Patient identified, Emergency Drugs available, Suction available and Patient being monitored Patient Re-evaluated:Patient Re-evaluated prior to induction Oxygen Delivery Method: Circle System Utilized Preoxygenation: Pre-oxygenation with 100% oxygen Induction Type: IV induction Ventilation: Mask ventilation without difficulty LMA: LMA inserted LMA Size: 5.0 Number of attempts: 1 Airway Equipment and Method: bite block Placement Confirmation: positive ETCO2 Tube secured with: Tape Dental Injury: Teeth and Oropharynx as per pre-operative assessment

## 2023-08-27 NOTE — Anesthesia Postprocedure Evaluation (Signed)
Anesthesia Post Note  Patient: Warren Hess  Procedure(s) Performed: RIGHT FLEXOR POLLICIS LONGUS RECONSTRUCTION (Right: Wrist) REMOVAL RETAINED HARDWARE RIGHT WRIST (Right: Wrist)     Patient location during evaluation: PACU Anesthesia Type: General Level of consciousness: awake Pain management: pain level controlled Vital Signs Assessment: post-procedure vital signs reviewed and stable Respiratory status: spontaneous breathing, nonlabored ventilation and respiratory function stable Cardiovascular status: blood pressure returned to baseline and stable Postop Assessment: no apparent nausea or vomiting Anesthetic complications: no   No notable events documented.  Last Vitals:  Vitals:   08/27/23 1600 08/27/23 1620  BP: (!) 142/87 (!) 143/84  Pulse: 75 83  Resp: 13 18  Temp:  (!) 36.2 C  SpO2: (!) 87% 96%    Last Pain:  Vitals:   08/27/23 1620  TempSrc: Temporal  PainSc: 0-No pain                 Warren Hess

## 2023-08-27 NOTE — Discharge Instructions (Addendum)
Took Oxycodone 10mg  at 3:45p. May take pain pill 9:45p. No ibuprofen until 2:30pm today if needed No Tylenol until after 4:15pm today if needed  Post Anesthesia Home Care Instructions  Activity: Get plenty of rest for the remainder of the day. A responsible individual must stay with you for 24 hours following the procedure.  For the next 24 hours, DO NOT: -Drive a car -Advertising copywriter -Drink alcoholic beverages -Take any medication unless instructed by your physician -Make any legal decisions or sign important papers.  Meals: Start with liquid foods such as gelatin or soup. Progress to regular foods as tolerated. Avoid greasy, spicy, heavy foods. If nausea and/or vomiting occur, drink only clear liquids until the nausea and/or vomiting subsides. Call your physician if vomiting continues.  Special Instructions/Symptoms: Your throat may feel dry or sore from the anesthesia or the breathing tube placed in your throat during surgery. If this causes discomfort, gargle with warm salt water. The discomfort should disappear within 24 hours.  If you had a scopolamine patch placed behind your ear for the management of post- operative nausea and/or vomiting:  1. The medication in the patch is effective for 72 hours, after which it should be removed.  Wrap patch in a tissue and discard in the trash. Wash hands thoroughly with soap and water. 2. You may remove the patch earlier than 72 hours if you experience unpleasant side effects which may include dry mouth, dizziness or visual disturbances. 3. Avoid touching the patch. Wash your hands with soap and water after contact with the patch.     [facility] Instructions Hand Surgery  Wound Care: Keep your hand elevated above the level of your heart.  Do not allow it to dangle by your side.  Keep the dressing dry and do not remove it unless your doctor advises you to do so.  He will usually change it at the time of your post-op visit.  Moving your  fingers is advised to stimulate circulation but will depend on the site of your surgery.  If you have a splint applied, your doctor will advise you regarding movement.  Activity: Do not drive or operate machinery today.  Rest today and then you may return to your normal activity and work as indicated by your physician.  Diet:  Drink liquids today or eat a light diet.  You may resume a regular diet tomorrow.    General expectations: Pain for two to three days. Fingers may become slightly swollen.  Call your doctor if any of the following occur: Severe pain not relieved by pain medication. Elevated temperature. Dressing soaked with blood. Inability to move fingers. White or bluish color to fingers.

## 2023-08-28 ENCOUNTER — Encounter (HOSPITAL_BASED_OUTPATIENT_CLINIC_OR_DEPARTMENT_OTHER): Payer: Self-pay | Admitting: Orthopedic Surgery

## 2023-09-02 DIAGNOSIS — M25641 Stiffness of right hand, not elsewhere classified: Secondary | ICD-10-CM | POA: Diagnosis not present

## 2023-09-02 DIAGNOSIS — S66811A Strain of other specified muscles, fascia and tendons at wrist and hand level, right hand, initial encounter: Secondary | ICD-10-CM | POA: Diagnosis not present

## 2023-09-02 DIAGNOSIS — M25631 Stiffness of right wrist, not elsewhere classified: Secondary | ICD-10-CM | POA: Diagnosis not present

## 2023-09-02 DIAGNOSIS — Z969 Presence of functional implant, unspecified: Secondary | ICD-10-CM | POA: Diagnosis not present

## 2023-09-02 DIAGNOSIS — M79641 Pain in right hand: Secondary | ICD-10-CM | POA: Diagnosis not present

## 2023-09-10 DIAGNOSIS — M25641 Stiffness of right hand, not elsewhere classified: Secondary | ICD-10-CM | POA: Diagnosis not present

## 2023-09-10 DIAGNOSIS — M25631 Stiffness of right wrist, not elsewhere classified: Secondary | ICD-10-CM | POA: Diagnosis not present

## 2023-09-10 DIAGNOSIS — S66811A Strain of other specified muscles, fascia and tendons at wrist and hand level, right hand, initial encounter: Secondary | ICD-10-CM | POA: Diagnosis not present

## 2023-09-10 DIAGNOSIS — M79641 Pain in right hand: Secondary | ICD-10-CM | POA: Diagnosis not present

## 2023-09-24 DIAGNOSIS — M25641 Stiffness of right hand, not elsewhere classified: Secondary | ICD-10-CM | POA: Diagnosis not present

## 2023-09-24 DIAGNOSIS — M79641 Pain in right hand: Secondary | ICD-10-CM | POA: Diagnosis not present

## 2023-09-24 DIAGNOSIS — M25631 Stiffness of right wrist, not elsewhere classified: Secondary | ICD-10-CM | POA: Diagnosis not present

## 2023-10-01 DIAGNOSIS — M25631 Stiffness of right wrist, not elsewhere classified: Secondary | ICD-10-CM | POA: Diagnosis not present

## 2023-10-01 DIAGNOSIS — M25641 Stiffness of right hand, not elsewhere classified: Secondary | ICD-10-CM | POA: Diagnosis not present

## 2023-10-01 DIAGNOSIS — M79641 Pain in right hand: Secondary | ICD-10-CM | POA: Diagnosis not present

## 2023-10-07 DIAGNOSIS — S66811A Strain of other specified muscles, fascia and tendons at wrist and hand level, right hand, initial encounter: Secondary | ICD-10-CM | POA: Diagnosis not present

## 2023-10-07 DIAGNOSIS — Z969 Presence of functional implant, unspecified: Secondary | ICD-10-CM | POA: Diagnosis not present

## 2023-10-08 DIAGNOSIS — M25631 Stiffness of right wrist, not elsewhere classified: Secondary | ICD-10-CM | POA: Diagnosis not present

## 2023-10-08 DIAGNOSIS — M79641 Pain in right hand: Secondary | ICD-10-CM | POA: Diagnosis not present

## 2023-10-08 DIAGNOSIS — M25641 Stiffness of right hand, not elsewhere classified: Secondary | ICD-10-CM | POA: Diagnosis not present

## 2023-10-15 DIAGNOSIS — M25631 Stiffness of right wrist, not elsewhere classified: Secondary | ICD-10-CM | POA: Diagnosis not present

## 2023-10-15 DIAGNOSIS — S62001A Unspecified fracture of navicular [scaphoid] bone of right wrist, initial encounter for closed fracture: Secondary | ICD-10-CM | POA: Diagnosis not present

## 2023-10-15 DIAGNOSIS — M79641 Pain in right hand: Secondary | ICD-10-CM | POA: Diagnosis not present

## 2023-10-15 DIAGNOSIS — M25641 Stiffness of right hand, not elsewhere classified: Secondary | ICD-10-CM | POA: Diagnosis not present

## 2023-10-29 DIAGNOSIS — F331 Major depressive disorder, recurrent, moderate: Secondary | ICD-10-CM | POA: Diagnosis not present

## 2023-10-29 DIAGNOSIS — M79641 Pain in right hand: Secondary | ICD-10-CM | POA: Diagnosis not present

## 2023-10-29 DIAGNOSIS — M25631 Stiffness of right wrist, not elsewhere classified: Secondary | ICD-10-CM | POA: Diagnosis not present

## 2023-10-29 DIAGNOSIS — M25641 Stiffness of right hand, not elsewhere classified: Secondary | ICD-10-CM | POA: Diagnosis not present

## 2023-10-29 DIAGNOSIS — F5101 Primary insomnia: Secondary | ICD-10-CM | POA: Diagnosis not present

## 2023-11-12 DIAGNOSIS — M25641 Stiffness of right hand, not elsewhere classified: Secondary | ICD-10-CM | POA: Diagnosis not present

## 2023-11-12 DIAGNOSIS — M25631 Stiffness of right wrist, not elsewhere classified: Secondary | ICD-10-CM | POA: Diagnosis not present

## 2023-11-12 DIAGNOSIS — M79641 Pain in right hand: Secondary | ICD-10-CM | POA: Diagnosis not present

## 2023-11-19 DIAGNOSIS — M25631 Stiffness of right wrist, not elsewhere classified: Secondary | ICD-10-CM | POA: Diagnosis not present

## 2023-11-19 DIAGNOSIS — M79641 Pain in right hand: Secondary | ICD-10-CM | POA: Diagnosis not present

## 2023-11-19 DIAGNOSIS — M25641 Stiffness of right hand, not elsewhere classified: Secondary | ICD-10-CM | POA: Diagnosis not present

## 2023-11-23 DIAGNOSIS — Z89511 Acquired absence of right leg below knee: Secondary | ICD-10-CM | POA: Diagnosis not present

## 2023-12-10 DIAGNOSIS — M79641 Pain in right hand: Secondary | ICD-10-CM | POA: Diagnosis not present

## 2023-12-10 DIAGNOSIS — M25641 Stiffness of right hand, not elsewhere classified: Secondary | ICD-10-CM | POA: Diagnosis not present

## 2023-12-10 DIAGNOSIS — M25631 Stiffness of right wrist, not elsewhere classified: Secondary | ICD-10-CM | POA: Diagnosis not present

## 2023-12-18 ENCOUNTER — Other Ambulatory Visit: Payer: Self-pay | Admitting: Orthopedic Surgery

## 2023-12-31 ENCOUNTER — Encounter (HOSPITAL_BASED_OUTPATIENT_CLINIC_OR_DEPARTMENT_OTHER): Payer: Self-pay | Admitting: Orthopedic Surgery

## 2023-12-31 ENCOUNTER — Other Ambulatory Visit: Payer: Self-pay

## 2023-12-31 NOTE — Progress Notes (Signed)
   12/31/23 1147  PAT Phone Screen  Is the patient taking a GLP-1 receptor agonist? (S)  Yes  Has the patient been informed on holding medication? (S)  Yes (1/28 last dose, will hold until after surgery)  Do You Have Diabetes? No  Do You Have Hypertension? No  Have You Ever Been to the ER for Asthma? No  Have You Taken Oral Steroids in the Past 3 Months? No  Do you Take Phenteramine or any Other Diet Drugs? No  Recent  Lab Work, EKG, CXR? No  Do you have a history of heart problems? No  Any Recent Hospitalizations? No  Height 5' 11 (1.803 m)  Weight 122 kg  Pat Appointment Scheduled No  Reason for No Appointment Not Needed

## 2024-01-07 ENCOUNTER — Ambulatory Visit (HOSPITAL_BASED_OUTPATIENT_CLINIC_OR_DEPARTMENT_OTHER): Payer: Commercial Managed Care - PPO | Admitting: Anesthesiology

## 2024-01-07 ENCOUNTER — Ambulatory Visit (HOSPITAL_BASED_OUTPATIENT_CLINIC_OR_DEPARTMENT_OTHER): Payer: Commercial Managed Care - PPO

## 2024-01-07 ENCOUNTER — Other Ambulatory Visit: Payer: Self-pay

## 2024-01-07 ENCOUNTER — Ambulatory Visit (HOSPITAL_BASED_OUTPATIENT_CLINIC_OR_DEPARTMENT_OTHER)
Admission: RE | Admit: 2024-01-07 | Discharge: 2024-01-07 | Disposition: A | Discharge status: Home / Self Care | Payer: Commercial Managed Care - PPO | Source: Ambulatory Visit | Attending: Orthopedic Surgery | Admitting: Orthopedic Surgery

## 2024-01-07 ENCOUNTER — Encounter (HOSPITAL_BASED_OUTPATIENT_CLINIC_OR_DEPARTMENT_OTHER): Payer: Self-pay | Admitting: Orthopedic Surgery

## 2024-01-07 ENCOUNTER — Encounter (HOSPITAL_BASED_OUTPATIENT_CLINIC_OR_DEPARTMENT_OTHER)
Admission: RE | Disposition: A | Discharge status: Home / Self Care | Payer: Self-pay | Source: Ambulatory Visit | Attending: Orthopedic Surgery

## 2024-01-07 DIAGNOSIS — Z89511 Acquired absence of right leg below knee: Secondary | ICD-10-CM | POA: Insufficient documentation

## 2024-01-07 DIAGNOSIS — F17210 Nicotine dependence, cigarettes, uncomplicated: Secondary | ICD-10-CM | POA: Insufficient documentation

## 2024-01-07 DIAGNOSIS — F32A Depression, unspecified: Secondary | ICD-10-CM | POA: Diagnosis not present

## 2024-01-07 DIAGNOSIS — Z79899 Other long term (current) drug therapy: Secondary | ICD-10-CM | POA: Diagnosis not present

## 2024-01-07 DIAGNOSIS — Z472 Encounter for removal of internal fixation device: Secondary | ICD-10-CM

## 2024-01-07 DIAGNOSIS — T8489XA Other specified complication of internal orthopedic prosthetic devices, implants and grafts, initial encounter: Secondary | ICD-10-CM | POA: Diagnosis not present

## 2024-01-07 HISTORY — PX: HARDWARE REMOVAL: SHX979

## 2024-01-07 SURGERY — REMOVAL, HARDWARE
Anesthesia: General | Site: Wrist | Laterality: Right

## 2024-01-07 MED ORDER — BUPIVACAINE HCL (PF) 0.25 % IJ SOLN
INTRAMUSCULAR | Status: DC | PRN
  Administered 2024-01-07: 8 mL

## 2024-01-07 MED ORDER — 0.9 % SODIUM CHLORIDE (POUR BTL) OPTIME
TOPICAL | Status: DC | PRN
  Administered 2024-01-07: 1000 mL

## 2024-01-07 MED ORDER — LACTATED RINGERS IV SOLN: INTRAVENOUS | Status: DC

## 2024-01-07 MED ORDER — AMISULPRIDE (ANTIEMETIC) 5 MG/2ML IV SOLN: 10.0000 mg | Freq: Once | INTRAVENOUS | Status: DC | PRN

## 2024-01-07 MED ORDER — HYDROMORPHONE HCL 1 MG/ML IJ SOLN
0.2500 mg | INTRAMUSCULAR | Status: DC | PRN
  Administered 2024-01-07: 0.25 mg via INTRAVENOUS
  Administered 2024-01-07: 0.5 mg via INTRAVENOUS
  Administered 2024-01-07: 0.25 mg via INTRAVENOUS

## 2024-01-07 MED ORDER — MIDAZOLAM HCL 2 MG/2ML IJ SOLN
INTRAMUSCULAR | Status: DC | PRN
Start: 2024-01-07 — End: 2024-01-07
  Administered 2024-01-07: 2 mg via INTRAVENOUS

## 2024-01-07 MED ORDER — OXYCODONE-ACETAMINOPHEN 5-325 MG PO TABS: 1.0000 | ORAL_TABLET | Freq: Four times a day (QID) | ORAL | 0 refills | Status: AC | PRN

## 2024-01-07 MED ORDER — PROPOFOL 10 MG/ML IV BOLUS
INTRAVENOUS | Status: DC | PRN
  Administered 2024-01-07: 200 mg via INTRAVENOUS

## 2024-01-07 MED ORDER — FENTANYL CITRATE (PF) 100 MCG/2ML IJ SOLN
INTRAMUSCULAR | Status: AC
  Filled 2024-01-07: qty 2

## 2024-01-07 MED ORDER — OXYCODONE HCL 5 MG/5ML PO SOLN: 5.0000 mg | Freq: Once | ORAL | Status: DC | PRN

## 2024-01-07 MED ORDER — MIDAZOLAM HCL 2 MG/2ML IJ SOLN
INTRAMUSCULAR | Status: AC
Start: 2024-01-07 — End: ?
  Filled 2024-01-07: qty 2

## 2024-01-07 MED ORDER — CEFAZOLIN SODIUM-DEXTROSE 3-4 GM/150ML-% IV SOLN
3.0000 g | INTRAVENOUS | Status: AC
  Administered 2024-01-07: 3 g via INTRAVENOUS

## 2024-01-07 MED ORDER — FENTANYL CITRATE (PF) 250 MCG/5ML IJ SOLN
INTRAMUSCULAR | Status: DC | PRN
  Administered 2024-01-07: 100 ug via INTRAVENOUS

## 2024-01-07 MED ORDER — DEXAMETHASONE SODIUM PHOSPHATE 10 MG/ML IJ SOLN
INTRAMUSCULAR | Status: DC | PRN
  Administered 2024-01-07: 10 mg via INTRAVENOUS

## 2024-01-07 MED ORDER — OXYCODONE HCL 5 MG PO TABS: 5.0000 mg | ORAL_TABLET | Freq: Once | ORAL | Status: DC | PRN

## 2024-01-07 MED ORDER — ONDANSETRON HCL 4 MG/2ML IJ SOLN
INTRAMUSCULAR | Status: DC | PRN
  Administered 2024-01-07: 4 mg via INTRAVENOUS

## 2024-01-07 MED ORDER — LIDOCAINE 2% (20 MG/ML) 5 ML SYRINGE
INTRAMUSCULAR | Status: DC | PRN
  Administered 2024-01-07: 60 mg via INTRAVENOUS

## 2024-01-07 MED ORDER — HYDROMORPHONE HCL 1 MG/ML IJ SOLN
INTRAMUSCULAR | Status: AC
  Filled 2024-01-07: qty 0.5

## 2024-01-07 MED ORDER — DEXMEDETOMIDINE HCL IN NACL 80 MCG/20ML IV SOLN
INTRAVENOUS | Status: DC | PRN
  Administered 2024-01-07 (×3): 4 ug via INTRAVENOUS

## 2024-01-07 MED ORDER — CEFAZOLIN SODIUM-DEXTROSE 3-4 GM/150ML-% IV SOLN
INTRAVENOUS | Status: AC
  Filled 2024-01-07: qty 150

## 2024-01-07 MED ORDER — MEPERIDINE HCL 25 MG/ML IJ SOLN: 6.2500 mg | INTRAMUSCULAR | Status: DC | PRN

## 2024-01-07 MED ORDER — SODIUM CHLORIDE 0.9 % IV SOLN: 12.5000 mg | INTRAVENOUS | Status: DC | PRN

## 2024-01-07 SURGICAL SUPPLY — 48 items
BLADE MINI RND TIP GREEN BEAV (BLADE) IMPLANT
BLADE SURG 15 STRL LF DISP TIS (BLADE) ×2 IMPLANT
BNDG COHESIVE 1X5 TAN STRL LF (GAUZE/BANDAGES/DRESSINGS) IMPLANT
BNDG ELASTIC 2INX 5YD STR LF (GAUZE/BANDAGES/DRESSINGS) IMPLANT
BNDG ELASTIC 3INX 5YD STR LF (GAUZE/BANDAGES/DRESSINGS) ×1 IMPLANT
BNDG ESMARK 4X9 LF (GAUZE/BANDAGES/DRESSINGS) IMPLANT
BNDG GAUZE DERMACEA FLUFF 4 (GAUZE/BANDAGES/DRESSINGS) ×1 IMPLANT
CHLORAPREP W/TINT 26 (MISCELLANEOUS) ×1 IMPLANT
CORD BIPOLAR FORCEPS 12FT (ELECTRODE) ×1 IMPLANT
COVER BACK TABLE 60X90IN (DRAPES) ×1 IMPLANT
COVER MAYO STAND STRL (DRAPES) ×1 IMPLANT
CUFF TOURN SGL QUICK 18X4 (TOURNIQUET CUFF) ×1 IMPLANT
DRAPE EXTREMITY T 121X128X90 (DISPOSABLE) ×1 IMPLANT
DRAPE OEC MINIVIEW 54X84 (DRAPES) IMPLANT
DRAPE SURG 17X23 STRL (DRAPES) ×1 IMPLANT
GAUZE PAD ABD 8X10 STRL (GAUZE/BANDAGES/DRESSINGS) IMPLANT
GAUZE SPONGE 4X4 12PLY STRL (GAUZE/BANDAGES/DRESSINGS) ×1 IMPLANT
GAUZE XEROFORM 1X8 LF (GAUZE/BANDAGES/DRESSINGS) ×1 IMPLANT
GLOVE BIO SURGEON STRL SZ 6.5 (GLOVE) IMPLANT
GLOVE BIO SURGEON STRL SZ7.5 (GLOVE) ×1 IMPLANT
GLOVE BIOGEL PI IND STRL 7.0 (GLOVE) IMPLANT
GLOVE BIOGEL PI IND STRL 8 (GLOVE) ×1 IMPLANT
GLOVE BIOGEL PI IND STRL 8.5 (GLOVE) IMPLANT
GLOVE SURG ORTHO 8.0 STRL STRW (GLOVE) IMPLANT
GLOVE SURG SS PI 6.5 STRL IVOR (GLOVE) IMPLANT
GOWN STRL REUS W/ TWL LRG LVL3 (GOWN DISPOSABLE) ×1 IMPLANT
GOWN STRL REUS W/TWL XL LVL3 (GOWN DISPOSABLE) ×1 IMPLANT
K-WIRE DBL .035X4 NSTRL (WIRE) ×1
KWIRE DBL .035X4 NSTRL (WIRE) IMPLANT
NDL HYPO 25X1 1.5 SAFETY (NEEDLE) IMPLANT
NEEDLE HYPO 25X1 1.5 SAFETY (NEEDLE) ×1
NS IRRIG 1000ML POUR BTL (IV SOLUTION) ×1 IMPLANT
PACK BASIN DAY SURGERY FS (CUSTOM PROCEDURE TRAY) ×1 IMPLANT
PAD CAST 3X4 CTTN HI CHSV (CAST SUPPLIES) IMPLANT
PADDING CAST ABS COTTON 4X4 ST (CAST SUPPLIES) ×1 IMPLANT
SPIKE FLUID TRANSFER (MISCELLANEOUS) ×1 IMPLANT
SPLINT PLASTER CAST XFAST 3X15 (CAST SUPPLIES) IMPLANT
STOCKINETTE 4X48 STRL (DRAPES) ×1 IMPLANT
STRIP CLOSURE SKIN 1/4X4 (GAUZE/BANDAGES/DRESSINGS) IMPLANT
SUT ETHILON 4 0 PS 2 18 (SUTURE) ×1 IMPLANT
SUT MNCRL AB 4-0 PS2 18 (SUTURE) IMPLANT
SUT VIC AB 3-0 FS2 27 (SUTURE) IMPLANT
SUT VIC AB 4-0 PS2 27 (SUTURE) IMPLANT
SYR 10ML LL (SYRINGE) IMPLANT
SYR BULB EAR ULCER 3OZ GRN STR (SYRINGE) ×1 IMPLANT
SYR CONTROL 10ML LL (SYRINGE) IMPLANT
TOWEL GREEN STERILE FF (TOWEL DISPOSABLE) ×2 IMPLANT
UNDERPAD 30X36 HEAVY ABSORB (UNDERPADS AND DIAPERS) ×1 IMPLANT

## 2024-01-07 NOTE — Discharge Instructions (Addendum)
[facility] Instructions Hand Surgery  Wound Care: Keep your hand elevated above the level of your heart.  Do not allow it to dangle by your side.  Keep the dressing dry and do not remove it unless your doctor advises you to do so.  He will usually change it at the time of your post-op visit.  Moving your fingers is advised to stimulate circulation but will depend on the site of your surgery.  If you have a splint applied, your doctor will advise you regarding movement.  Activity: Do not drive or operate machinery today.  Rest today and then you may return to your normal activity and work as indicated by your physician.  Diet:  Drink liquids today or eat a light diet.  You may resume a regular diet tomorrow.    General expectations: Pain for two to three days. Fingers may become slightly swollen.  Call your doctor if any of the following occur: Severe pain not relieved by pain medication. Elevated temperature. Dressing soaked with blood. Inability to move fingers. White or bluish color to fingers.     Post Anesthesia Home Care Instructions  Activity: Get plenty of rest for the remainder of the day. A responsible individual must stay with you for 24 hours following the procedure.  For the next 24 hours, DO NOT: -Drive a car -Advertising copywriter -Drink alcoholic beverages -Take any medication unless instructed by your physician -Make any legal decisions or sign important papers.  Meals: Start with liquid foods such as gelatin or soup. Progress to regular foods as tolerated. Avoid greasy, spicy, heavy foods. If nausea and/or vomiting occur, drink only clear liquids until the nausea and/or vomiting subsides. Call your physician if vomiting continues.  Special Instructions/Symptoms: Your throat may feel dry or sore from the anesthesia or the breathing tube placed in your throat during surgery. If this causes discomfort, gargle with warm salt water. The discomfort should disappear  within 24 hours.  If you had a scopolamine patch placed behind your ear for the management of post- operative nausea and/or vomiting:  1. The medication in the patch is effective for 72 hours, after which it should be removed.  Wrap patch in a tissue and discard in the trash. Wash hands thoroughly with soap and water. 2. You may remove the patch earlier than 72 hours if you experience unpleasant side effects which may include dry mouth, dizziness or visual disturbances. 3. Avoid touching the patch. Wash your hands with soap and water after contact with the patch.    Post Anesthesia Home Care Instructions  Activity: Get plenty of rest for the remainder of the day. A responsible individual must stay with you for 24 hours following the procedure.  For the next 24 hours, DO NOT: -Drive a car -Advertising copywriter -Drink alcoholic beverages -Take any medication unless instructed by your physician -Make any legal decisions or sign important papers.  Meals: Start with liquid foods such as gelatin or soup. Progress to regular foods as tolerated. Avoid greasy, spicy, heavy foods. If nausea and/or vomiting occur, drink only clear liquids until the nausea and/or vomiting subsides. Call your physician if vomiting continues.  Special Instructions/Symptoms: Your throat may feel dry or sore from the anesthesia or the breathing tube placed in your throat during surgery. If this causes discomfort, gargle with warm salt water. The discomfort should disappear within 24 hours.

## 2024-01-07 NOTE — Anesthesia Procedure Notes (Addendum)
Procedure Name: Intubation Date/Time: 01/07/2024 2:13 PM  Performed by: Alvera Novel, CRNAPre-anesthesia Checklist: Patient identified, Emergency Drugs available, Suction available and Patient being monitored Patient Re-evaluated:Patient Re-evaluated prior to induction Oxygen Delivery Method: Circle System Utilized Preoxygenation: Pre-oxygenation with 100% oxygen Induction Type: IV induction Ventilation: Mask ventilation without difficulty LMA: LMA inserted LMA Size: 5.0 Tube type: Oral Number of attempts: 1 Airway Equipment and Method: Stylet and Oral airway Placement Confirmation: ETT inserted through vocal cords under direct vision, positive ETCO2 and breath sounds checked- equal and bilateral Tube secured with: Tape Dental Injury: Teeth and Oropharynx as per pre-operative assessment

## 2024-01-07 NOTE — Anesthesia Preprocedure Evaluation (Signed)
Anesthesia Evaluation  Patient identified by MRN, date of birth, ID band Patient awake    Reviewed: Allergy & Precautions, NPO status , Patient's Chart, lab work & pertinent test results  Airway Mallampati: II  TM Distance: >3 FB Neck ROM: Full    Dental  (+) Chipped, Missing,    Pulmonary Current SmokerPatient did not abstain from smoking.   Pulmonary exam normal        Cardiovascular negative cardio ROS Normal cardiovascular exam     Neuro/Psych  PSYCHIATRIC DISORDERS  Depression    negative neurological ROS     GI/Hepatic negative GI ROS, Neg liver ROS,,,  Endo/Other  Patient on GLP-1 Agonist  Renal/GU negative Renal ROS     Musculoskeletal negative musculoskeletal ROS (+)    Abdominal  (+) + obese  Peds  Hematology negative hematology ROS (+)   Anesthesia Other Findings   Reproductive/Obstetrics                             Anesthesia Physical Anesthesia Plan  ASA: 2  Anesthesia Plan: General   Post-op Pain Management:    Induction: Intravenous  PONV Risk Score and Plan: 1 and Ondansetron and Treatment may vary due to age or medical condition  Airway Management Planned: LMA  Additional Equipment:   Intra-op Plan:   Post-operative Plan: Extubation in OR  Informed Consent: I have reviewed the patients History and Physical, chart, labs and discussed the procedure including the risks, benefits and alternatives for the proposed anesthesia with the patient or authorized representative who has indicated his/her understanding and acceptance.     Dental advisory given  Plan Discussed with: CRNA  Anesthesia Plan Comments:        Anesthesia Quick Evaluation

## 2024-01-07 NOTE — Anesthesia Postprocedure Evaluation (Signed)
Anesthesia Post Note  Patient: Warren Hess  Procedure(s) Performed: REMOVAL RETAINED HARDWARE RIGHT WRIST (Right: Wrist)     Patient location during evaluation: PACU Anesthesia Type: General Level of consciousness: awake and alert Pain management: pain level controlled Vital Signs Assessment: post-procedure vital signs reviewed and stable Respiratory status: spontaneous breathing, nonlabored ventilation and respiratory function stable Cardiovascular status: blood pressure returned to baseline and stable Postop Assessment: no apparent nausea or vomiting Anesthetic complications: no   No notable events documented.  Last Vitals:  Vitals:   01/07/24 1600 01/07/24 1609  BP: 108/76 113/70  Pulse: 67 62  Resp: 12   Temp:  36.8 C  SpO2: 96% 95%    Last Pain:  Vitals:   01/07/24 1609  TempSrc: Temporal  PainSc: 5                  Lowella Curb
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
## 2024-01-07 NOTE — Op Note (Signed)
I assisted Surgeons and Role:    * Betha Loa, MD - Primary    Cindee Salt, MD - Assisting on the Procedure(s): REMOVAL RETAINED HARDWARE RIGHT WRIST on 01/07/2024.  I provided assistance on this case as follows: Set up, approach, identification of the deep implant, removal of bone preventing removal, removal of the hardware, closure of the wound and application of the dressing and splint.  Electronically signed by: Cindee Salt, MD Date: 01/07/2024 Time: 3:10 PM

## 2024-01-07 NOTE — Transfer of Care (Signed)
Immediate Anesthesia Transfer of Care Note  Patient: Warren Hess  Procedure(s) Performed: REMOVAL RETAINED HARDWARE RIGHT WRIST (Right: Wrist)  Patient Location: PACU  Anesthesia Type:General  Level of Consciousness: awake, alert , and oriented  Airway & Oxygen Therapy: Patient Spontanous Breathing and Patient connected to face mask oxygen  Post-op Assessment: Report given to RN and Post -op Vital signs reviewed and stable  Post vital signs: Reviewed and stable  Last Vitals:  Vitals Value Taken Time  BP 113/82 01/07/24 1530  Temp 36.4 C 01/07/24 1521  Pulse 68 01/07/24 1535  Resp 13 01/07/24 1535  SpO2 92 % 01/07/24 1535  Vitals shown include unfiled device data.  Last Pain:  Vitals:   01/07/24 1530  TempSrc:   PainSc: 10-Worst pain ever      Patients Stated Pain Goal: 3 (01/07/24 1120)  Complications: No notable events documented.

## 2024-01-07 NOTE — Op Note (Signed)
NAME: Warren Hess MEDICAL RECORD NO: [id number] DATE OF BIRTH: 02/14/1991 FACILITY: [facility] LOCATION: [facility] PHYSICIAN: Tami Ribas, MD   OPERATIVE REPORT   DATE OF PROCEDURE: 01/07/24    PREOPERATIVE DIAGNOSIS: Retained hardware right scaphoid   POSTOPERATIVE DIAGNOSIS: Retained hardware right scaphoid   PROCEDURE: Removal of deep retained hardware right scaphoid   SURGEON:  Betha Loa, M.D.   ASSISTANT: Cindee Salt, MD   ANESTHESIA:  General   INTRAVENOUS FLUIDS:  Per anesthesia flow sheet.   ESTIMATED BLOOD LOSS:  Minimal.   COMPLICATIONS:  None.   SPECIMENS:  none   TOURNIQUET TIME:    Total Tourniquet Time Documented: Upper Arm (Right) - 43 minutes Total: Upper Arm (Right) - 43 minutes    DISPOSITION:  Stable to PACU.   INDICATIONS: 33 year old male status post ORIF of distal radius fracture and scaphoid fracture.  He had subsequent FPL tendon rupture which underwent reconstruction and removal of the distal radius hardware.  The scaphoid screw is prominent.  He wishes to have this removed.  Risks, benefits and alternatives of surgery were discussed including the risks of blood loss, infection, damage to nerves, vessels, tendons, ligaments, bone for surgery, need for additional surgery, complications with wound healing, continued pain, stiffness.  He voiced understanding of these risks and elected to proceed.  OPERATIVE COURSE:  After being identified preoperatively by myself,  the patient and I agreed on the procedure and site of the procedure.  The surgical site was marked.  Surgical consent had been signed. Preoperative IV antibiotic prophylaxis was given. He was transferred to the operating room and placed on the operating table in supine position with the Right upper extremity on an arm board.  General anesthesia was induced by the anesthesiologist.  Right upper extremity was prepped and draped in normal sterile orthopedic fashion.  A  surgical pause was performed between the surgeons, anesthesia, and operating room staff and all were in agreement as to the patient, procedure, and site of procedure.  Tourniquet at the proximal aspect of the extremity was inflated to 250 mmHg after exsanguination of the arm with an Esmarch bandage.  Incision was made on the dorsum of the wrist over the previous surgical incision.  This was carried in subcutaneous tissues by spreading technique.  The fourth dorsal compartment extensor tendons were retracted.  The capsule was opened.  The screw was able to be visualized.  A guidepin was able to be placed down at.  There was some bony overgrowth at the edge of the screw.  This was removed with the curette.  The screwdriver was able to be placed back onto the screw.  The screw would however spend in the scaphoid without backing out.  A tooth from the pickup was able to be put into the threads at the edge of the screw to allow the screw to back out.  The screw was removed.  C-arm was used in AP and lateral projections to ensure appropriate removal of the hardware which was the case.  No remaining radiopaque hardware noted.  The wound was copiously irrigated with sterile saline.  The retinaculum over the fourth dorsal compartment extensor tendons was repaired with a 4-0 Vicryl suture.  Inverted interrupted 4-0 Vicryl sutures were placed in subcutaneous tissues and skin was closed with 4-0 nylon in a horizontal mattress fashion.  Wound was injected with quarter percent plain Marcaine to aid in postoperative analgesia.  It was dressed with sterile Xeroform and 4 x  4's and an ABD used as a splint.  This was wrapped with Kerlix and Ace bandage.  The tourniquet was deflated at 43 minutes.  Fingertips were pink with brisk capillary refill after deflation of tourniquet.  The operative  drapes were broken down.  The patient was awoken from anesthesia safely.  He was transferred back to the stretcher and taken to PACU in stable  condition.  I will see him back in the office in 1 week for postoperative followup.  I will give him a prescription for Norco 5/325 1-2 tabs PO q6 hours prn pain, dispense # 15.   Betha Loa, MD Electronically signed, 01/07/24

## 2024-01-08 ENCOUNTER — Encounter (HOSPITAL_BASED_OUTPATIENT_CLINIC_OR_DEPARTMENT_OTHER): Payer: Self-pay | Admitting: Orthopedic Surgery

## 2024-01-15 DIAGNOSIS — S66811A Strain of other specified muscles, fascia and tendons at wrist and hand level, right hand, initial encounter: Secondary | ICD-10-CM | POA: Diagnosis not present

## 2024-01-15 DIAGNOSIS — S52601A Unspecified fracture of lower end of right ulna, initial encounter for closed fracture: Secondary | ICD-10-CM | POA: Diagnosis not present

## 2024-01-15 DIAGNOSIS — Z969 Presence of functional implant, unspecified: Secondary | ICD-10-CM | POA: Diagnosis not present

## 2024-01-22 DIAGNOSIS — Z969 Presence of functional implant, unspecified: Secondary | ICD-10-CM | POA: Diagnosis not present

## 2024-02-19 DIAGNOSIS — Z4789 Encounter for other orthopedic aftercare: Secondary | ICD-10-CM | POA: Diagnosis not present

## 2024-04-07 DIAGNOSIS — J020 Streptococcal pharyngitis: Secondary | ICD-10-CM | POA: Diagnosis not present

## 2024-04-07 DIAGNOSIS — R509 Fever, unspecified: Secondary | ICD-10-CM | POA: Diagnosis not present

## 2024-04-14 DIAGNOSIS — R5383 Other fatigue: Secondary | ICD-10-CM | POA: Diagnosis not present

## 2024-04-14 DIAGNOSIS — E782 Mixed hyperlipidemia: Secondary | ICD-10-CM | POA: Diagnosis not present

## 2024-04-14 DIAGNOSIS — Z0000 Encounter for general adult medical examination without abnormal findings: Secondary | ICD-10-CM | POA: Diagnosis not present

## 2024-04-21 DIAGNOSIS — Z6839 Body mass index (BMI) 39.0-39.9, adult: Secondary | ICD-10-CM | POA: Diagnosis not present

## 2024-04-21 DIAGNOSIS — Z0000 Encounter for general adult medical examination without abnormal findings: Secondary | ICD-10-CM | POA: Diagnosis not present

## 2024-04-21 DIAGNOSIS — E66812 Obesity, class 2: Secondary | ICD-10-CM | POA: Diagnosis not present

## 2024-04-27 DIAGNOSIS — Z89511 Acquired absence of right leg below knee: Secondary | ICD-10-CM | POA: Diagnosis not present

## 2024-05-16 DIAGNOSIS — R059 Cough, unspecified: Secondary | ICD-10-CM | POA: Diagnosis not present
